# Patient Record
Sex: Female | Born: 1955
Health system: Southern US, Community
[De-identification: ages and names within clinical notes are randomized; demographics above are authoritative.]

## PROBLEM LIST (undated history)

## (undated) DIAGNOSIS — E785 Hyperlipidemia, unspecified: Secondary | ICD-10-CM

## (undated) DIAGNOSIS — F419 Anxiety disorder, unspecified: Secondary | ICD-10-CM

## (undated) DIAGNOSIS — K219 Gastro-esophageal reflux disease without esophagitis: Secondary | ICD-10-CM

## (undated) DIAGNOSIS — M8580 Other specified disorders of bone density and structure, unspecified site: Secondary | ICD-10-CM

## (undated) DIAGNOSIS — E559 Vitamin D deficiency, unspecified: Secondary | ICD-10-CM

## (undated) DIAGNOSIS — F329 Major depressive disorder, single episode, unspecified: Secondary | ICD-10-CM

## (undated) DIAGNOSIS — R1319 Other dysphagia: Secondary | ICD-10-CM

## (undated) DIAGNOSIS — I10 Essential (primary) hypertension: Secondary | ICD-10-CM

## (undated) DIAGNOSIS — G4700 Insomnia, unspecified: Secondary | ICD-10-CM

## (undated) DIAGNOSIS — F32A Depression, unspecified: Secondary | ICD-10-CM

## (undated) HISTORY — DX: Anxiety disorder, unspecified: F41.9

## (undated) HISTORY — PX: BREAST EXCISIONAL BIOPSY: SUR124

## (undated) HISTORY — PX: SQUAMOUS CELL CARCINOMA EXCISION: SHX2433

## (undated) HISTORY — PX: COLONOSCOPY WITH ESOPHAGOGASTRODUODENOSCOPY (EGD): SHX5779

## (undated) HISTORY — DX: Gastro-esophageal reflux disease without esophagitis: K21.9

## (undated) HISTORY — PX: BREAST SURGERY: SHX581

## (undated) HISTORY — DX: Depression, unspecified: F32.A

---
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
## 1998-06-07 HISTORY — PX: ESOPHAGOGASTRIC FUNDOPLICATION: SHX405

## 2005-02-02 ENCOUNTER — Ambulatory Visit: Payer: Self-pay | Admitting: Unknown Physician Specialty

## 2006-02-03 ENCOUNTER — Ambulatory Visit: Payer: Self-pay | Admitting: Unknown Physician Specialty

## 2007-02-14 ENCOUNTER — Ambulatory Visit: Payer: Self-pay | Admitting: Unknown Physician Specialty

## 2007-07-11 ENCOUNTER — Ambulatory Visit: Payer: Self-pay | Admitting: Unknown Physician Specialty

## 2007-12-13 ENCOUNTER — Ambulatory Visit: Payer: Self-pay | Admitting: Internal Medicine

## 2008-02-16 ENCOUNTER — Ambulatory Visit: Payer: Self-pay | Admitting: Unknown Physician Specialty

## 2008-08-15 ENCOUNTER — Ambulatory Visit: Payer: Self-pay | Admitting: Internal Medicine

## 2008-12-17 ENCOUNTER — Ambulatory Visit: Payer: Self-pay | Admitting: Unknown Physician Specialty

## 2009-02-20 ENCOUNTER — Ambulatory Visit: Payer: Self-pay | Admitting: Internal Medicine

## 2009-05-31 ENCOUNTER — Emergency Department: Payer: Self-pay | Admitting: Emergency Medicine

## 2010-05-06 ENCOUNTER — Ambulatory Visit: Payer: Self-pay

## 2011-06-15 ENCOUNTER — Ambulatory Visit: Payer: Self-pay

## 2012-08-24 ENCOUNTER — Ambulatory Visit: Payer: Self-pay

## 2012-09-08 ENCOUNTER — Ambulatory Visit: Payer: Self-pay

## 2013-09-10 ENCOUNTER — Ambulatory Visit: Payer: Self-pay

## 2013-10-01 ENCOUNTER — Ambulatory Visit: Payer: Self-pay | Admitting: Internal Medicine

## 2013-10-22 ENCOUNTER — Ambulatory Visit: Payer: Self-pay | Admitting: Unknown Physician Specialty

## 2013-10-22 LAB — HM COLONOSCOPY

## 2013-10-24 LAB — PATHOLOGY REPORT

## 2016-04-19 ENCOUNTER — Ambulatory Visit
Admission: RE | Admit: 2016-04-19 | Discharge: 2016-04-19 | Disposition: A | Discharge status: Home / Self Care | Payer: Self-pay | Source: Ambulatory Visit | Attending: Oncology | Admitting: Oncology

## 2016-04-19 ENCOUNTER — Encounter (INDEPENDENT_AMBULATORY_CARE_PROVIDER_SITE_OTHER): Payer: Self-pay

## 2016-04-19 ENCOUNTER — Ambulatory Visit: Payer: Self-pay | Attending: Oncology

## 2016-04-19 VITALS — BP 156/92 | HR 76 | Temp 98.1°F | Ht 65.0 in | Wt 141.0 lb

## 2016-04-19 DIAGNOSIS — Z0000 Encounter for general adult medical examination without abnormal findings: Secondary | ICD-10-CM

## 2016-04-19 NOTE — Progress Notes (Signed)
Subjective:     Patient ID: Pamela Barrett, female   DOB: September 08, 1955, 60 y.o.   MRN: [id number]  HPI   Review of Systems     Objective:   Physical Exam  Pulmonary/Chest: Right breast exhibits no inverted nipple, no mass, no nipple discharge, no skin change and no tenderness. Left breast exhibits no inverted nipple, no mass, no nipple discharge, no skin change and no tenderness. Breasts are symmetrical.         Assessment:  60 year old femalepatient presents for [facility] clinic visit.  Patient screened, and meets [facility] eligibility.  Patient does not have insurance, Medicare or Medicaid.  Handout given on Affordable Care Act.  Instructed patient on breast self-exam using teach back method.  CBE unremarkable.  Had a benign left breast biopsy at age 60 with Dr. Jamal Collin.  Patient states she her husband passed away 8 years go.  Her 45 year old grandson lives with her.      Plan:     Sent for bilateral screening mammogram.

## 2016-04-20 NOTE — Progress Notes (Unsigned)
Letter mailed from [facility] to notify of normal mammogram results.  Patient to return in one year for annual screening.  Copy to [facility]. 

## 2016-04-21 ENCOUNTER — Ambulatory Visit: Payer: Self-pay

## 2016-05-04 DIAGNOSIS — N939 Abnormal uterine and vaginal bleeding, unspecified: Secondary | ICD-10-CM

## 2016-05-04 HISTORY — DX: Abnormal uterine and vaginal bleeding, unspecified: N93.9

## 2019-04-22 ENCOUNTER — Encounter: Payer: Self-pay | Admitting: Nurse Practitioner

## 2019-04-24 ENCOUNTER — Other Ambulatory Visit: Payer: Self-pay

## 2019-04-25 ENCOUNTER — Ambulatory Visit (INDEPENDENT_AMBULATORY_CARE_PROVIDER_SITE_OTHER): Payer: BLUE CROSS/BLUE SHIELD | Admitting: Family Medicine

## 2019-04-25 ENCOUNTER — Encounter: Payer: Self-pay | Admitting: Family Medicine

## 2019-04-25 VITALS — BP 126/79 | HR 117 | Temp 98.4°F | Ht 64.0 in | Wt 175.2 lb

## 2019-04-25 DIAGNOSIS — M25511 Pain in right shoulder: Secondary | ICD-10-CM | POA: Diagnosis not present

## 2019-04-25 DIAGNOSIS — Z1231 Encounter for screening mammogram for malignant neoplasm of breast: Secondary | ICD-10-CM

## 2019-04-25 DIAGNOSIS — Z23 Encounter for immunization: Secondary | ICD-10-CM | POA: Diagnosis not present

## 2019-04-25 DIAGNOSIS — F419 Anxiety disorder, unspecified: Secondary | ICD-10-CM

## 2019-04-25 DIAGNOSIS — R222 Localized swelling, mass and lump, trunk: Secondary | ICD-10-CM | POA: Diagnosis not present

## 2019-04-25 DIAGNOSIS — G8929 Other chronic pain: Secondary | ICD-10-CM

## 2019-04-25 DIAGNOSIS — Z1211 Encounter for screening for malignant neoplasm of colon: Secondary | ICD-10-CM

## 2019-04-25 MED ORDER — CYCLOBENZAPRINE HCL 10 MG PO TABS: 10.0000 mg | ORAL_TABLET | Freq: Every day | ORAL | 0 refills | Status: DC

## 2019-04-25 MED ORDER — NAPROXEN 500 MG PO TABS: 500.0000 mg | ORAL_TABLET | Freq: Two times a day (BID) | ORAL | 0 refills | Status: DC

## 2019-04-25 NOTE — Progress Notes (Signed)
BP 126/79   Pulse (!) 117   Temp 98.4 F (36.9 C) (Oral)   Ht 5\' 4"  (1.626 m)   Wt 175 lb 3.2 oz (79.5 kg)   SpO2 97%   BMI 30.07 kg/m    Subjective:    Patient ID: Pamela Barrett, female    DOB: 1955-12-20, 63 y.o.   MRN: [id number]  HPI: Pamela Barrett is a 63 y.o. female who presents today to establish care.   Chief Complaint  Patient presents with  . Establish Care  . Shoulder Pain    pt states her right shoulder has been bothering her for the last 3 months  . Cyst    on abdomin, just wants it checked    ANXIETY/STRESS Duration:controlled Anxious mood: no  Excessive worrying: no Irritability: no  Sweating: no Nausea: no Palpitations:no Hyperventilation: no Panic attacks: no Agoraphobia: no  Obscessions/compulsions: no Depressed mood: no Depression screen PHQ 2/9 04/25/2019  Decreased Interest 0  Down, Depressed, Hopeless 0  PHQ - 2 Score 0  Altered sleeping 0  Tired, decreased energy 0  Change in appetite 0  Feeling bad or failure about yourself  0  Trouble concentrating 0  Moving slowly or fidgety/restless 0  Suicidal thoughts 0  PHQ-9 Score 0  Difficult doing work/chores Not difficult at all   GAD 7 : Generalized Anxiety Score 04/25/2019  Nervous, Anxious, on Edge 0  Control/stop worrying 0  Worry too much - different things 0  Trouble relaxing 0  Restless 0  Easily annoyed or irritable 0  Afraid - awful might happen 0  Total GAD 7 Score 0  Anxiety Difficulty Not difficult at all   Anhedonia: no Weight changes: no Insomnia: no   Hypersomnia: no Fatigue/loss of energy: no Feelings of worthlessness: no Feelings of guilt: no Impaired concentration/indecisiveness: no Suicidal ideations: no  Crying spells: no Recent Stressors/Life Changes: no   Relationship problems: no   Family stress: no     Financial stress: no    Job stress: no    Recent death/loss: no  SHOULDER PAIN- works as a Scientist, product/process development, has her own business. Has been  bothering her R arm for about 3 months. Notes that that is the arm she uses a lot to clean Duration: 3 months Involved shoulder: right Mechanism of injury: unknown Location: trap into her biceps Onset:gradual Severity: moderate  Quality:  Dull ache Frequency: intermittent, has been constant for the past week Radiation: into the trap Aggravating factors: lifting and movement  Alleviating factors: nothing  Status: worse Treatments attempted: rest, ice and heat  Relief with NSAIDs?:  No NSAIDs Taken Weakness: no Numbness: no Decreased grip strength: no Redness: no Swelling: no Bruising: no Fevers: no   LUMP Duration: 4 months- hasn't had any one look at yet  Location: L side of her belly Onset: gradual Painful: no Discomfort: no Status:  unknown Trauma: no Redness: no Bruising: no Recent infection: no Swollen lymph nodes: no Requesting removal: no History of the same: no  Active Ambulatory Problems    Diagnosis Date Noted  . Anxiety 04/25/2019   Resolved Ambulatory Problems    Diagnosis Date Noted  . Abnormal uterine bleeding (AUB) 05/04/2016   Past Medical History:  Diagnosis Date  . Depression   . GERD (gastroesophageal reflux disease)    Past Surgical History:  Procedure Laterality Date  . BREAST EXCISIONAL BIOPSY Left 20+ yrs ago   neg  . ESOPHAGOGASTRIC FUNDOPLICATION  [id number]   Outpatient Encounter  Medications as of 04/25/2019  Medication Sig  . aspirin EC 81 MG tablet Take 81 mg by mouth daily.  . citalopram (CELEXA) 20 MG tablet Take 20 mg by mouth daily.  . cyclobenzaprine (FLEXERIL) 10 MG tablet Take 1 tablet (10 mg total) by mouth at bedtime.  . naproxen (NAPROSYN) 500 MG tablet Take 1 tablet (500 mg total) by mouth 2 (two) times daily with a meal.   No facility-administered encounter medications on file as of 04/25/2019.    No Known Allergies  Social History   Socioeconomic History  . Marital status: Widowed    Spouse name: Not on file  .  Number of children: Not on file  . Years of education: Not on file  . Highest education level: Not on file  Occupational History  . Not on file  Social Needs  . Financial resource strain: Not on file  . Food insecurity    Worry: Not on file    Inability: Not on file  . Transportation needs    Medical: Not on file    Non-medical: Not on file  Tobacco Use  . Smoking status: Former Smoker    Types: Cigarettes    Quit date: 04/07/2017    Years since quitting: 2.0  . Smokeless tobacco: Never Used  Substance and Sexual Activity  . Alcohol use: Not Currently  . Drug use: Never  . Sexual activity: Not Currently  Lifestyle  . Physical activity    Days per week: Not on file    Minutes per session: Not on file  . Stress: Not on file  Relationships  . Social Herbalist on phone: Not on file    Gets together: Not on file    Attends religious service: Not on file    Active member of club or organization: Not on file    Attends meetings of clubs or organizations: Not on file    Relationship status: Not on file  Other Topics Concern  . Not on file  Social History Narrative  . Not on file   Family History  Problem Relation Age of Onset  . Alzheimer's disease Father   . Cancer Sister        colon  . Cancer Paternal Grandfather        colon    Review of Systems  Constitutional: Negative.   Respiratory: Negative.   Cardiovascular: Negative.   Gastrointestinal: Negative.   Musculoskeletal: Positive for arthralgias and myalgias. Negative for back pain, gait problem, joint swelling, neck pain and neck stiffness.  Skin: Negative.   Neurological: Negative.   Hematological: Negative.   Psychiatric/Behavioral: Negative.     Per HPI unless specifically indicated above     Objective:    BP 126/79   Pulse (!) 117   Temp 98.4 F (36.9 C) (Oral)   Ht 5\' 4"  (1.626 m)   Wt 175 lb 3.2 oz (79.5 kg)   SpO2 97%   BMI 30.07 kg/m   Wt Readings from Last 3 Encounters:   04/25/19 175 lb 3.2 oz (79.5 kg)  04/19/16 141 lb (64 kg)    Physical Exam Vitals signs and nursing note reviewed.  Constitutional:      General: She is not in acute distress.    Appearance: Normal appearance. She is not ill-appearing, toxic-appearing or diaphoretic.  HENT:     Head: Normocephalic and atraumatic.     Right Ear: External ear normal.     Left Ear: External ear  normal.     Nose: Nose normal.     Mouth/Throat:     Mouth: Mucous membranes are moist.     Pharynx: Oropharynx is clear.  Eyes:     General: No scleral icterus.       Right eye: No discharge.        Left eye: No discharge.     Extraocular Movements: Extraocular movements intact.     Conjunctiva/sclera: Conjunctivae normal.     Pupils: Pupils are equal, round, and reactive to light.  Neck:     Musculoskeletal: Normal range of motion and neck supple.  Cardiovascular:     Rate and Rhythm: Normal rate and regular rhythm.     Pulses: Normal pulses.     Heart sounds: Normal heart sounds. No murmur. No friction rub. No gallop.   Pulmonary:     Effort: Pulmonary effort is normal. No respiratory distress.     Breath sounds: Normal breath sounds. No stridor. No wheezing, rhonchi or rales.  Chest:     Chest wall: No tenderness.  Abdominal:     General: Abdomen is flat. Bowel sounds are normal. There is no distension.     Palpations: Abdomen is soft. There is mass (non-defined thickening in LUQ, does not feel visceral).     Tenderness: There is no abdominal tenderness. There is no right CVA tenderness, left CVA tenderness, guarding or rebound.     Hernia: No hernia is present.  Musculoskeletal:        General: Tenderness (in trap and along bicep insertion) present.     Comments: Decreased ROM to abduction  Skin:    General: Skin is warm and dry.     Capillary Refill: Capillary refill takes less than 2 seconds.     Coloration: Skin is not jaundiced or pale.     Findings: No bruising, erythema, lesion or rash.   Neurological:     General: No focal deficit present.     Mental Status: She is alert and oriented to person, place, and time. Mental status is at baseline.  Psychiatric:        Mood and Affect: Mood normal.        Behavior: Behavior normal.        Thought Content: Thought content normal.        Judgment: Judgment normal.     Results for orders placed or performed in visit on 04/25/19  HM COLONOSCOPY  Result Value Ref Range   HM Colonoscopy See Report (in chart) See Report (in chart), Patient Reported      Assessment & Plan:   Problem List Items Addressed This Visit      Other   Anxiety    Under good control on her current regimen. Has another refill on it so doesn't need it just yet. Call with any concerns. Continue to monitor.      Relevant Medications   citalopram (CELEXA) 20 MG tablet    Other Visit Diagnoses    Chronic right shoulder pain    -  Primary   Will start stretches, naproxen and flexeril. Call if not getting better, recheck 1 month. May need x-ray, PT, if not improving.    Relevant Medications   aspirin EC 81 MG tablet   citalopram (CELEXA) 20 MG tablet   cyclobenzaprine (FLEXERIL) 10 MG tablet   naproxen (NAPROSYN) 500 MG tablet   Subcutaneous nodule of abdominal wall       Ill-defined on exam. Will check [place]. Await  results. Call wiht any concerns.    Relevant Orders   US Abdomen Complete   Need for influenza vaccination       Flu shot given today.    Relevant Orders   Flu Vaccine QUAD 36+ mos PF IM (Fluarix & Fluzone Quad PF) (Completed)   Encounter for screening mammogram for malignant neoplasm of breast       Mammogram ordered today.   Relevant Orders   MM 3D SCREEN BREAST BILATERAL   Need for Tdap vaccination       Tdap given today.   Relevant Orders   Tdap vaccine greater than or equal to 7yo IM (Completed)   Screening for colon cancer       Due for colon cancer- referral generated today.   Relevant Orders   Ambulatory referral to  Gastroenterology       Follow up plan: Return in about 4 weeks (around 05/23/2019) for physical with Jolene, please get records from Dr. Gwynneth Aliment.

## 2019-04-25 NOTE — Assessment & Plan Note (Signed)
Under good control on her current regimen. Has another refill on it so doesn't need it just yet. Call with any concerns. Continue to monitor.

## 2019-04-25 NOTE — Patient Instructions (Addendum)
Call for your mammogram:[place] [facility]  Address: [address], [place], [place] [address]  Phone: [phone]   Influenza (Flu) Vaccine (Inactivated or Recombinant): What You Need to Know 1. Why get vaccinated? Influenza vaccine can prevent influenza (flu). Flu is a contagious disease that spreads around the [place] every year, usually between October and May. Anyone can get the flu, but it is more dangerous for some people. Infants and young children, people 7 years of age and older, pregnant women, and people with certain health conditions or a weakened immune system are at greatest risk of flu complications. Pneumonia, bronchitis, sinus infections and ear infections are examples of flu-related complications. If you have a medical condition, such as heart disease, cancer or diabetes, flu can make it worse. Flu can cause fever and chills, sore throat, muscle aches, fatigue, cough, headache, and runny or stuffy nose. Some people may have vomiting and diarrhea, though this is more common in children than adults. Each year thousands of people in the [place] die from flu, and many more are hospitalized. Flu vaccine prevents millions of illnesses and flu-related visits to the doctor each year. 2. Influenza vaccine CDC recommends everyone 18 months of age and older get vaccinated every flu season. Children 6 months through 14 years of age may need 2 doses during a single flu season. Everyone else needs only 1 dose each flu season. It takes about 2 weeks for protection to develop after vaccination. There are many flu viruses, and they are always changing. Each year a new flu vaccine is made to protect against three or four viruses that are likely to cause disease in the upcoming flu season. Even when the vaccine doesn't exactly match these viruses, it may still provide some protection. Influenza vaccine does not cause flu. Influenza vaccine may be given at  the same time as other vaccines. 3. Talk with your health care provider Tell your vaccine provider if the person getting the vaccine:  Has had an allergic reaction after a previous dose of influenza vaccine, or has any severe, life-threatening allergies.  Has ever had Guillain-Barr Syndrome (also called GBS). In some cases, your health care provider may decide to postpone influenza vaccination to a future visit. People with minor illnesses, such as a cold, may be vaccinated. People who are moderately or severely ill should usually wait until they recover before getting influenza vaccine. Your health care provider can give you more information. 4. Risks of a vaccine reaction  Soreness, redness, and swelling where shot is given, fever, muscle aches, and headache can happen after influenza vaccine.  There may be a very small increased risk of Guillain-Barr Syndrome (GBS) after inactivated influenza vaccine (the flu shot). Young children who get the flu shot along with pneumococcal vaccine (PCV13), and/or DTaP vaccine at the same time might be slightly more likely to have a seizure caused by fever. Tell your health care provider if a child who is getting flu vaccine has ever had a seizure. People sometimes faint after medical procedures, including vaccination. Tell your provider if you feel dizzy or have vision changes or ringing in the ears. As with any medicine, there is a very remote chance of a vaccine causing a severe allergic reaction, other serious injury, or death. 5. What if there is a serious problem? An allergic reaction could occur after the vaccinated person leaves the clinic. If you see signs of a severe allergic reaction (hives, swelling of the  face and throat, difficulty breathing, a fast heartbeat, dizziness, or weakness), call 9-1-1 and get the person to the nearest hospital. For other signs that concern you, call your health care provider. Adverse reactions should be reported to  the Vaccine Adverse Event Reporting System (VAERS). Your health care provider will usually file this report, or you can do it yourself. Visit the VAERS website at www.vaers.SamedayNews.es or call 346-171-2911.VAERS is only for reporting reactions, and VAERS staff do not give medical advice. 6. The National Vaccine Injury Compensation Program The [employer] Vaccine Injury Compensation Program (VICP) is a federal program that was created to compensate people who may have been injured by certain vaccines. Visit the VICP website at GoldCloset.com.ee or call 5121372669 to learn about the program and about filing a claim. There is a time limit to file a claim for compensation. 7. How can I learn more?  Ask your healthcare provider.  Call your local or state health department.  Contact the Centers for Disease Control and Prevention (CDC): ? Call 7133479421 (1-800-CDC-INFO) or ? Visit CDC's https://gibson.com/ Vaccine Information Statement (Interim) Inactivated Influenza Vaccine (01/19/2018) This information is not intended to replace advice given to you by your health care provider. Make sure you discuss any questions you have with your health care provider. Document Released: 03/18/2006 Document Revised: 09/12/2018 Document Reviewed: 01/23/2018 Elsevier Patient Education  [place]. https://www.cdc.gov/vaccines/hcp/vis/vis-statements/tdap.pdf">  Tdap Vaccine (Tetanus, Diphtheria and Pertussis): What You Need to Know 1. Why get vaccinated? Tetanus, diphtheria and pertussis are very serious diseases. Tdap vaccine can protect [place] from these diseases. And, Tdap vaccine given to pregnant women can protect newborn babies against pertussis.Marland Kitchen TETANUS (Lockjaw) is rare in the [place] today. It causes painful muscle tightening and stiffness, usually all over the body.  It can lead to tightening of muscles in the head and neck so you can't open your mouth, swallow, or sometimes even  breathe. Tetanus kills about 1 out of 10 people who are infected even after receiving the best medical care. DIPHTHERIA is also rare in the [place] today. It can cause a thick coating to form in the back of the throat.  It can lead to breathing problems, heart failure, paralysis, and death. PERTUSSIS (Whooping Cough) causes severe coughing spells, which can cause difficulty breathing, vomiting and disturbed sleep.  It can also lead to weight loss, incontinence, and rib fractures. Up to 2 in 100 adolescents and 5 in 100 adults with pertussis are hospitalized or have complications, which could include pneumonia or death. These diseases are caused by bacteria. Diphtheria and pertussis are spread from person to person through secretions from coughing or sneezing. Tetanus enters the body through cuts, scratches, or wounds. Before vaccines, as many as 200,000 cases of diphtheria, 200,000 cases of pertussis, and hundreds of cases of tetanus, were reported in the [place] each year. Since vaccination began, reports of cases for tetanus and diphtheria have dropped by about 99% and for pertussis by about 80%. 2. Tdap vaccine Tdap vaccine can protect adolescents and adults from tetanus, diphtheria, and pertussis. One dose of Tdap is routinely given at age 23 or 9. People who did not get Tdap at that age should get it as soon as possible. Tdap is especially important for healthcare professionals and anyone having close contact with a baby younger than 12 months. Pregnant women should get a dose of Tdap during every pregnancy, to protect the newborn from pertussis. Infants are most at risk for severe, life-threatening complications from  pertussis. Another vaccine, called Td, protects against tetanus and diphtheria, but not pertussis. A Td booster should be given every 10 years. Tdap may be given as one of these boosters if you have never gotten Tdap before. Tdap may also be given after a severe cut or  burn to prevent tetanus infection. Your doctor or the person giving you the vaccine can give you more information. Tdap may safely be given at the same time as other vaccines. 3. Some people should not get this vaccine  A person who has ever had a life-threatening allergic reaction after a previous dose of any diphtheria, tetanus or pertussis containing vaccine, OR has a severe allergy to any part of this vaccine, should not get Tdap vaccine. Tell the person giving the vaccine about any severe allergies.  Anyone who had coma or long repeated seizures within 7 days after a childhood dose of DTP or DTaP, or a previous dose of Tdap, should not get Tdap, unless a cause other than the vaccine was found. They can still get Td.  Talk to your doctor if you: ? have seizures or another nervous system problem, ? had severe pain or swelling after any vaccine containing diphtheria, tetanus or pertussis, ? ever had a condition called Guillain-Barr Syndrome (GBS), ? aren't feeling well on the day the shot is scheduled. 4. Risks With any medicine, including vaccines, there is a chance of side effects. These are usually mild and go away on their own. Serious reactions are also possible but are rare. Most people who get Tdap vaccine do not have any problems with it. Mild problems following Tdap (Did not interfere with activities)  Pain where the shot was given (about 3 in 4 adolescents or 2 in 3 adults)  Redness or swelling where the shot was given (about 1 person in 5)  Mild fever of at least 100.36F (up to about 1 in 25 adolescents or 1 in 100 adults)  Headache (about 3 or 4 people in 10)  Tiredness (about 1 person in 3 or 4)  Nausea, vomiting, diarrhea, stomach ache (up to 1 in 4 adolescents or 1 in 10 adults)  Chills, sore joints (about 1 person in 10)  Body aches (about 1 person in 3 or 4)  Rash, swollen glands (uncommon) Moderate problems following Tdap (Interfered with activities, but did  not require medical attention)  Pain where the shot was given (up to 1 in 5 or 6)  Redness or swelling where the shot was given (up to about 1 in 16 adolescents or 1 in 12 adults)  Fever over 102F (about 1 in 100 adolescents or 1 in 250 adults)  Headache (about 1 in 7 adolescents or 1 in 10 adults)  Nausea, vomiting, diarrhea, stomach ache (up to 1 or 3 people in 100)  Swelling of the entire arm where the shot was given (up to about 1 in 500). Severe problems following Tdap (Unable to perform usual activities; required medical attention)  Swelling, severe pain, bleeding and redness in the arm where the shot was given (rare). Problems that could happen after any vaccine:  People sometimes faint after a medical procedure, including vaccination. Sitting or lying down for about 15 minutes can help prevent fainting, and injuries caused by a fall. Tell your doctor if you feel dizzy, or have vision changes or ringing in the ears.  Some people get severe pain in the shoulder and have difficulty moving the arm where a shot was given. This happens  very rarely.  Any medication can cause a severe allergic reaction. Such reactions from a vaccine are very rare, estimated at fewer than 1 in a million doses, and would happen within a few minutes to a few hours after the vaccination. As with any medicine, there is a very remote chance of a vaccine causing a serious injury or death. The safety of vaccines is always being monitored. For more information, visit: http://www.aguilar.org/ 5. What if there is a serious problem? What should I look for?  Look for anything that concerns you, such as signs of a severe allergic reaction, very high fever, or unusual behavior. Signs of a severe allergic reaction can include hives, swelling of the face and throat, difficulty breathing, a fast heartbeat, dizziness, and weakness. These would usually start a few minutes to a few hours after the vaccination. What  should I do?  If you think it is a severe allergic reaction or other emergency that can't wait, call 9-1-1 or get the person to the nearest hospital. Otherwise, call your doctor.  Afterward, the reaction should be reported to the Vaccine Adverse Event Reporting System (VAERS). Your doctor might file this report, or you can do it yourself through the VAERS web site at www.vaers.SamedayNews.es, or by calling (786) 217-9933. VAERS does not give medical advice. 6. The National Vaccine Injury Compensation Program The [employer] Vaccine Injury Compensation Program (VICP) is a federal program that was created to compensate people who may have been injured by certain vaccines. Persons who believe they may have been injured by a vaccine can learn about the program and about filing a claim by calling 440 213 5513 or visiting the [place] website at GoldCloset.com.ee. There is a time limit to file a claim for compensation. 7. How can I learn more?  Ask your doctor. He or she can give you the vaccine package insert or suggest other sources of information.  Call your local or state health department.  Contact the Centers for Disease Control and Prevention (CDC): ? Call 3012041118 (1-800-CDC-INFO) or ? Visit CDC's website at http://hunter.com/ Vaccine Information Statement Tdap Vaccine (07/31/2013) This information is not intended to replace advice given to you by your health care provider. Make sure you discuss any questions you have with your health care provider. Document Released: 11/23/2011 Document Revised: 01/09/2018 Document Reviewed: 01/09/2018 Elsevier Interactive Patient Education  [place].  Shoulder Exercises Ask your health care provider which exercises are safe for you. Do exercises exactly as told by your health care provider and adjust them as directed. It is normal to feel mild stretching, pulling, tightness, or discomfort as you do these exercises. Stop right away if  you feel sudden pain or your pain gets worse. Do not begin these exercises until told by your health care provider. Stretching exercises External rotation and abduction This exercise is sometimes called corner stretch. This exercise rotates your arm outward (external rotation) and moves your arm out from your body (abduction). 1. Stand in a doorway with one of your feet slightly in front of the other. This is called a staggered stance. If you cannot reach your forearms to the door frame, stand facing a corner of a room. 2. Choose one of the following positions as told by your health care provider: ? Place your hands and forearms on the door frame above your head. ? Place your hands and forearms on the door frame at the height of your head. ? Place your hands on the door frame at the height of  your elbows. 3. Slowly move your weight onto your front foot until you feel a stretch across your chest and in the front of your shoulders. Keep your head and chest upright and keep your abdominal muscles tight. 4. Hold for __________ seconds. 5. To release the stretch, shift your weight to your back foot. Repeat __________ times. Complete this exercise __________ times a day. Extension, standing 1. Stand and hold a broomstick, a cane, or a similar object behind your back. ? Your hands should be a little wider than shoulder width apart. ? Your palms should face away from your back. 2. Keeping your elbows straight and your shoulder muscles relaxed, move the stick away from your body until you feel a stretch in your shoulders (extension). ? Avoid shrugging your shoulders while you move the stick. Keep your shoulder blades tucked down toward the middle of your back. 3. Hold for __________ seconds. 4. Slowly return to the starting position. Repeat __________ times. Complete this exercise __________ times a day. Range-of-motion exercises Pendulum  1. Stand near a wall or a surface that you can hold onto for  balance. 2. Bend at the waist and let your left / right arm hang straight down. Use your other arm to support you. Keep your back straight and do not lock your knees. 3. Relax your left / right arm and shoulder muscles, and move your hips and your trunk so your left / right arm swings freely. Your arm should swing because of the motion of your body, not because you are using your arm or shoulder muscles. 4. Keep moving your hips and trunk so your arm swings in the following directions, as told by your health care provider: ? Side to side. ? Forward and backward. ? In clockwise and counterclockwise circles. 5. Continue each motion for __________ seconds, or for as long as told by your health care provider. 6. Slowly return to the starting position. Repeat __________ times. Complete this exercise __________ times a day. Shoulder flexion, standing  1. Stand and hold a broomstick, a cane, or a similar object. Place your hands a little more than shoulder width apart on the object. Your left / right hand should be palm up, and your other hand should be palm down. 2. Keep your elbow straight and your shoulder muscles relaxed. Push the stick up with your healthy arm to raise your left / right arm in front of your body, and then over your head until you feel a stretch in your shoulder (flexion). ? Avoid shrugging your shoulder while you raise your arm. Keep your shoulder blade tucked down toward the middle of your back. 3. Hold for __________ seconds. 4. Slowly return to the starting position. Repeat __________ times. Complete this exercise __________ times a day. Shoulder abduction, standing 1. Stand and hold a broomstick, a cane, or a similar object. Place your hands a little more than shoulder width apart on the object. Your left / right hand should be palm up, and your other hand should be palm down. 2. Keep your elbow straight and your shoulder muscles relaxed. Push the object across your body toward  your left / right side. Raise your left / right arm to the side of your body (abduction) until you feel a stretch in your shoulder. ? Do not raise your arm above shoulder height unless your health care provider tells you to do that. ? If directed, raise your arm over your head. ? Avoid shrugging your shoulder while you  raise your arm. Keep your shoulder blade tucked down toward the middle of your back. 3. Hold for __________ seconds. 4. Slowly return to the starting position. Repeat __________ times. Complete this exercise __________ times a day. Internal rotation  1. Place your left / right hand behind your back, palm up. 2. Use your other hand to dangle an exercise band, a towel, or a similar object over your shoulder. Grasp the band with your left / right hand so you are holding on to both ends. 3. Gently pull up on the band until you feel a stretch in the front of your left / right shoulder. The movement of your arm toward the center of your body is called internal rotation. ? Avoid shrugging your shoulder while you raise your arm. Keep your shoulder blade tucked down toward the middle of your back. 4. Hold for __________ seconds. 5. Release the stretch by letting go of the band and lowering your hands. Repeat __________ times. Complete this exercise __________ times a day. Strengthening exercises External rotation  1. Sit in a stable chair without armrests. 2. Secure an exercise band to a stable object at elbow height on your left / right side. 3. Place a soft object, such as a folded towel or a small pillow, between your left / right upper arm and your body to move your elbow about 4 inches (10 cm) away from your side. 4. Hold the end of the exercise band so it is tight and there is no slack. 5. Keeping your elbow pressed against the soft object, slowly move your forearm out, away from your abdomen (external rotation). Keep your body steady so only your forearm moves. 6. Hold for  __________ seconds. 7. Slowly return to the starting position. Repeat __________ times. Complete this exercise __________ times a day. Shoulder abduction  1. Sit in a stable chair without armrests, or stand up. 2. Hold a __________ weight in your left / right hand, or hold an exercise band with both hands. 3. Start with your arms straight down and your left / right palm facing in, toward your body. 4. Slowly lift your left / right hand out to your side (abduction). Do not lift your hand above shoulder height unless your health care provider tells you that this is safe. ? Keep your arms straight. ? Avoid shrugging your shoulder while you do this movement. Keep your shoulder blade tucked down toward the middle of your back. 5. Hold for __________ seconds. 6. Slowly lower your arm, and return to the starting position. Repeat __________ times. Complete this exercise __________ times a day. Shoulder extension 1. Sit in a stable chair without armrests, or stand up. 2. Secure an exercise band to a stable object in front of you so it is at shoulder height. 3. Hold one end of the exercise band in each hand. Your palms should face each other. 4. Straighten your elbows and lift your hands up to shoulder height. 5. Step back, away from the secured end of the exercise band, until the band is tight and there is no slack. 6. Squeeze your shoulder blades together as you pull your hands down to the sides of your thighs (extension). Stop when your hands are straight down by your sides. Do not let your hands go behind your body. 7. Hold for __________ seconds. 8. Slowly return to the starting position. Repeat __________ times. Complete this exercise __________ times a day. Shoulder row 1. Sit in a stable chair without  armrests, or stand up. 2. Secure an exercise band to a stable object in front of you so it is at waist height. 3. Hold one end of the exercise band in each hand. Position your palms so that  your thumbs are facing the ceiling (neutral position). 4. Bend each of your elbows to a 90-degree angle (right angle) and keep your upper arms at your sides. 5. Step back until the band is tight and there is no slack. 6. Slowly pull your elbows back behind you. 7. Hold for __________ seconds. 8. Slowly return to the starting position. Repeat __________ times. Complete this exercise __________ times a day. Shoulder press-ups  1. Sit in a stable chair that has armrests. Sit upright, with your feet flat on the floor. 2. Put your hands on the armrests so your elbows are bent and your fingers are pointing forward. Your hands should be about even with the sides of your body. 3. Push down on the armrests and use your arms to lift yourself off the chair. Straighten your elbows and lift yourself up as much as you comfortably can. ? Move your shoulder blades down, and avoid letting your shoulders move up toward your ears. ? Keep your feet on the ground. As you get stronger, your feet should support less of your body weight as you lift yourself up. 4. Hold for __________ seconds. 5. Slowly lower yourself back into the chair. Repeat __________ times. Complete this exercise __________ times a day. Wall push-ups  1. Stand so you are facing a stable wall. Your feet should be about one arm-length away from the wall. 2. Lean forward and place your palms on the wall at shoulder height. 3. Keep your feet flat on the floor as you bend your elbows and lean forward toward the wall. 4. Hold for __________ seconds. 5. Straighten your elbows to push yourself back to the starting position. Repeat __________ times. Complete this exercise __________ times a day. This information is not intended to replace advice given to you by your health care provider. Make sure you discuss any questions you have with your health care provider. Document Released: 04/07/2005 Document Revised: 09/15/2018 Document Reviewed:  06/23/2018 Elsevier Patient Education  2020 [employer].

## 2019-05-01 ENCOUNTER — Ambulatory Visit
Admission: RE | Admit: 2019-05-01 | Discharge: 2019-05-01 | Disposition: A | Discharge status: Home / Self Care | Payer: BLUE CROSS/BLUE SHIELD | Source: Ambulatory Visit | Attending: Family Medicine | Admitting: Family Medicine

## 2019-05-01 ENCOUNTER — Other Ambulatory Visit: Payer: Self-pay | Admitting: Family Medicine

## 2019-05-01 ENCOUNTER — Other Ambulatory Visit: Payer: Self-pay

## 2019-05-01 DIAGNOSIS — R161 Splenomegaly, not elsewhere classified: Secondary | ICD-10-CM

## 2019-05-01 DIAGNOSIS — R222 Localized swelling, mass and lump, trunk: Secondary | ICD-10-CM | POA: Diagnosis not present

## 2019-05-23 ENCOUNTER — Other Ambulatory Visit: Payer: Self-pay | Admitting: Family Medicine

## 2019-06-15 ENCOUNTER — Encounter: Payer: BLUE CROSS/BLUE SHIELD | Admitting: Nurse Practitioner

## 2019-06-24 ENCOUNTER — Other Ambulatory Visit: Payer: Self-pay | Admitting: Family Medicine

## 2019-07-20 ENCOUNTER — Ambulatory Visit (INDEPENDENT_AMBULATORY_CARE_PROVIDER_SITE_OTHER): Payer: 59 | Admitting: Nurse Practitioner

## 2019-07-20 ENCOUNTER — Other Ambulatory Visit: Payer: Self-pay

## 2019-07-20 ENCOUNTER — Encounter: Payer: Self-pay | Admitting: Nurse Practitioner

## 2019-07-20 VITALS — BP 119/83 | HR 81 | Temp 98.2°F | Ht 64.5 in | Wt 186.0 lb

## 2019-07-20 DIAGNOSIS — Z0000 Encounter for general adult medical examination without abnormal findings: Secondary | ICD-10-CM | POA: Diagnosis not present

## 2019-07-20 DIAGNOSIS — E669 Obesity, unspecified: Secondary | ICD-10-CM | POA: Insufficient documentation

## 2019-07-20 DIAGNOSIS — F419 Anxiety disorder, unspecified: Secondary | ICD-10-CM | POA: Diagnosis not present

## 2019-07-20 DIAGNOSIS — Z1231 Encounter for screening mammogram for malignant neoplasm of breast: Secondary | ICD-10-CM

## 2019-07-20 DIAGNOSIS — E6609 Other obesity due to excess calories: Secondary | ICD-10-CM

## 2019-07-20 DIAGNOSIS — Z6831 Body mass index (BMI) 31.0-31.9, adult: Secondary | ICD-10-CM

## 2019-07-20 DIAGNOSIS — Z87891 Personal history of nicotine dependence: Secondary | ICD-10-CM | POA: Diagnosis not present

## 2019-07-20 DIAGNOSIS — E559 Vitamin D deficiency, unspecified: Secondary | ICD-10-CM | POA: Diagnosis not present

## 2019-07-20 DIAGNOSIS — Z6829 Body mass index (BMI) 29.0-29.9, adult: Secondary | ICD-10-CM | POA: Insufficient documentation

## 2019-07-20 DIAGNOSIS — Z6828 Body mass index (BMI) 28.0-28.9, adult: Secondary | ICD-10-CM | POA: Insufficient documentation

## 2019-07-20 MED ORDER — BUSPIRONE HCL 5 MG PO TABS: 5.0000 mg | ORAL_TABLET | ORAL | 3 refills | Status: DC | PRN

## 2019-07-20 NOTE — Assessment & Plan Note (Signed)
History of low level reported, requested Vit D level check.  Will check and initiate supplement as needed.

## 2019-07-20 NOTE — Assessment & Plan Note (Signed)
Recommend continued cessation.  Smoked 1 PPD for 25 years and quit 3 years ago, not qualified for CT lung screening due to less than 30 years. ?

## 2019-07-20 NOTE — Progress Notes (Signed)
BP 119/83 (BP Location: Left Arm, Cuff Size: Normal)   Pulse 81   Temp 98.2 F (36.8 C) (Oral)   Ht 5' 4.5" (1.638 m)   Wt 186 lb (84.4 kg)   SpO2 98%   BMI 31.43 kg/m    Subjective:    Patient ID: Pamela Barrett, female    DOB: 09-30-1955, 64 y.o.   MRN: [id number]  HPI: Pamela Barrett is a 64 y.o. female presenting on 07/20/2019 for comprehensive medical examination. Current medical complaints include:none  She currently lives with: grandson -- is at [facility], major cyber security Menopausal Symptoms: no   ANXIETY/STRESS Continues on Celexa 20 MG.  Lost husband 11 years ago to massive MI.  States she is having chest pain again, anxiety presented like this 8 years ago and had normal cardiac work-up, was started on Celexa, which has been working well.  Her anxiety never presented with panic attacks or anxious feelings in past, just with chest pain.  She quit smoking 3 years ago, smoked for 25 years, 1 PPD. Duration:stable Anxious mood: no  Excessive worrying: no Irritability: no  Sweating: yes Nausea: no Palpitations:no Hyperventilation: no Panic attacks: no Agoraphobia: no  Obscessions/compulsions: no Depressed mood: no Depression screen [facility] 2/9 07/20/2019 04/25/2019  Decreased Interest 0 0  Down, Depressed, Hopeless 0 0  PHQ - 2 Score 0 0  Altered sleeping - 0  Tired, decreased energy - 0  Change in appetite - 0  Feeling bad or failure about yourself  - 0  Trouble concentrating - 0  Moving slowly or fidgety/restless - 0  Suicidal thoughts - 0  PHQ-9 Score - 0  Difficult doing work/chores - Not difficult at all   Anhedonia: no Weight changes: no Insomnia: none Hypersomnia: no Fatigue/loss of energy: no Feelings of worthlessness: no Feelings of guilt: no Impaired concentration/indecisiveness: yes Suicidal ideations: no  Crying spells: no Recent Stressors/Life Changes: no   Relationship problems: no   Family stress: no     Financial stress: no    Job  stress: no    Recent death/loss: no  The patient does not have a history of falls. I did not complete a risk assessment for falls. A plan of care for falls was not documented.   Past Medical History:  Past Medical History:  Diagnosis Date  . Abnormal uterine bleeding (AUB) 05/04/2016   Last Assessment & Plan:   Appears to be AUB-P vs AUB-M; likely cervical polyp visualized on speculum exam and EMB performed  TVUS ordered  My suspicion is that this is most likely a benign process. As she does have a 30 pack year history, I counseled the patient extensively on smoking cessation, as this is her biggest risk factor for chronic disease.   Endometrial Biopsy Procedure Note  Urine p  . Anxiety   . Depression   . GERD (gastroesophageal reflux disease)     Surgical History:  Past Surgical History:  Procedure Laterality Date  . BREAST EXCISIONAL BIOPSY Left 20+ yrs ago   neg  . ESOPHAGOGASTRIC FUNDOPLICATION  [id number]    Medications:  Current Outpatient Medications on File Prior to Visit  Medication Sig  . aspirin EC 81 MG tablet Take 81 mg by mouth daily.  . citalopram (CELEXA) 20 MG tablet Take 20 mg by mouth daily.   No current facility-administered medications on file prior to visit.    Allergies:  No Known Allergies  Social History:  Social History   Socioeconomic History  .  Marital status: Widowed    Spouse name: Not on file  . Number of children: Not on file  . Years of education: Not on file  . Highest education level: Not on file  Occupational History  . Not on file  Tobacco Use  . Smoking status: Former Smoker    Types: Cigarettes    Quit date: 04/07/2017    Years since quitting: 2.2  . Smokeless tobacco: Never Used  Substance and Sexual Activity  . Alcohol use: Not Currently  . Drug use: Never  . Sexual activity: Not Currently  Other Topics Concern  . Not on file  Social History Narrative  . Not on file   Social Determinants of Health   Financial  Resource Strain:   . Difficulty of Paying Living Expenses: Not on file  Food Insecurity:   . Worried About Charity fundraiser in the Last Year: Not on file  . Ran Out of Food in the Last Year: Not on file  Transportation Needs:   . Lack of Transportation (Medical): Not on file  . Lack of Transportation (Non-Medical): Not on file  Physical Activity:   . Days of Exercise per Week: Not on file  . Minutes of Exercise per Session: Not on file  Stress:   . Feeling of Stress : Not on file  Social Connections:   . Frequency of Communication with Friends and Family: Not on file  . Frequency of Social Gatherings with Friends and Family: Not on file  . Attends Religious Services: Not on file  . Active Member of Clubs or Organizations: Not on file  . Attends Archivist Meetings: Not on file  . Marital Status: Not on file  Intimate Partner Violence:   . Fear of Current or Ex-Partner: Not on file  . Emotionally Abused: Not on file  . Physically Abused: Not on file  . Sexually Abused: Not on file   Social History   Tobacco Use  Smoking Status Former Smoker  . Types: Cigarettes  . Quit date: 04/07/2017  . Years since quitting: 2.2  Smokeless Tobacco Never Used   Social History   Substance and Sexual Activity  Alcohol Use Not Currently    Family History:  Family History  Problem Relation Age of Onset  . Alzheimer's disease Father   . Cancer Sister        colon  . Skin cancer Brother   . Cancer Paternal Grandfather        colon  . Varicose Veins Son     Past medical history, surgical history, medications, allergies, family history and social history reviewed with patient today and changes made to appropriate areas of the chart.   Review of Systems - negative All other ROS negative except what is listed above and in the HPI.      Objective:    BP 119/83 (BP Location: Left Arm, Cuff Size: Normal)   Pulse 81   Temp 98.2 F (36.8 C) (Oral)   Ht 5' 4.5" (1.638 m)    Wt 186 lb (84.4 kg)   SpO2 98%   BMI 31.43 kg/m   Wt Readings from Last 3 Encounters:  07/20/19 186 lb (84.4 kg)  04/25/19 175 lb 3.2 oz (79.5 kg)  04/19/16 141 lb (64 kg)    Physical Exam Constitutional:      General: She is awake. She is not in acute distress.    Appearance: She is well-developed. She is not ill-appearing.  HENT:  Head: Normocephalic and atraumatic.     Right Ear: Hearing, tympanic membrane, ear canal and external ear normal. No drainage.     Left Ear: Hearing, tympanic membrane, ear canal and external ear normal. No drainage.     Nose: Nose normal.     Right Sinus: No maxillary sinus tenderness or frontal sinus tenderness.     Left Sinus: No maxillary sinus tenderness or frontal sinus tenderness.     Mouth/Throat:     Mouth: Mucous membranes are moist.     Pharynx: Oropharynx is clear. Uvula midline. No pharyngeal swelling, oropharyngeal exudate or posterior oropharyngeal erythema.  Eyes:     General: Lids are normal.        Right eye: No discharge.        Left eye: No discharge.     Extraocular Movements: Extraocular movements intact.     Conjunctiva/sclera: Conjunctivae normal.     Pupils: Pupils are equal, round, and reactive to light.     Visual Fields: Right eye visual fields normal and left eye visual fields normal.  Neck:     Thyroid: No thyromegaly.     Vascular: No carotid bruit.     Trachea: Trachea normal.  Cardiovascular:     Rate and Rhythm: Normal rate and regular rhythm.     Heart sounds: Normal heart sounds. No murmur. No gallop.   Pulmonary:     Effort: Pulmonary effort is normal. No accessory muscle usage or respiratory distress.     Breath sounds: Normal breath sounds.  Chest:     Breasts:        Right: Normal.        Left: Normal.  Abdominal:     General: Bowel sounds are normal.     Palpations: Abdomen is soft. There is no hepatomegaly or splenomegaly.     Tenderness: There is no abdominal tenderness.  Musculoskeletal:         General: Normal range of motion.     Cervical back: Normal range of motion and neck supple.     Right lower leg: No edema.     Left lower leg: No edema.  Lymphadenopathy:     Head:     Right side of head: No submental, submandibular, tonsillar, preauricular or posterior auricular adenopathy.     Left side of head: No submental, submandibular, tonsillar, preauricular or posterior auricular adenopathy.     Cervical: No cervical adenopathy.     Upper Body:     Right upper body: No supraclavicular, axillary or pectoral adenopathy.     Left upper body: No supraclavicular, axillary or pectoral adenopathy.  Skin:    General: Skin is warm and dry.     Capillary Refill: Capillary refill takes less than 2 seconds.     Findings: No rash.  Neurological:     Mental Status: She is alert and oriented to person, place, and time.     Cranial Nerves: Cranial nerves are intact.     Gait: Gait is intact.     Deep Tendon Reflexes: Reflexes are normal and symmetric.     Reflex Scores:      Brachioradialis reflexes are 2+ on the right side and 2+ on the left side.      Patellar reflexes are 2+ on the right side and 2+ on the left side. Psychiatric:        Attention and Perception: Attention normal.        Mood and Affect: Mood normal.  Speech: Speech normal.        Behavior: Behavior normal. Behavior is cooperative.        Thought Content: Thought content normal.        Judgment: Judgment normal.    EKG IN OFFICE: NSR with rate 70 and normal axis  Results for orders placed or performed in visit on 04/25/19  HM COLONOSCOPY  Result Value Ref Range   HM Colonoscopy See Report (in chart) See Report (in chart), Patient Reported      Assessment & Plan:   Problem List Items Addressed This Visit      Other   Anxiety    Chronic, ongoing with recent exacerbation. EKG in office today NSR.  Will continue Celexa and add on Buspar to take as needed, script sent.  She denies SI/HI.  Discussed  meditation for anxiety.  Return in 6 weeks.      Relevant Medications   busPIRone (BUSPAR) 5 MG tablet   Other Relevant Orders   EKG 12-Lead (Completed)   TSH   Vitamin D deficiency    History of low level reported, requested Vit D level check.  Will check and initiate supplement as needed.      Relevant Orders   VITAMIN D 25 Hydroxy (Vit-D Deficiency, Fractures)   Obesity    Recommend continued focus on healthy diet choices and regular physical activity (30 minutes 5 days a week).  Focus on small goals at a time and set timeline to achieve.      Former smoker    Recommend continued cessation.  Smoked 1 PPD for 25 years and quit 3 years ago, not qualified for CT lung screening due to less than 30 years.       Other Visit Diagnoses    Annual physical exam    -  Primary   Annual physical labs to include TSH, CMP, CBC, lipid   Relevant Orders   CBC with Differential/Platelet   Comprehensive metabolic panel   Lipid Panel w/o Chol/HDL Ratio   TSH   Breast cancer screening by mammogram       Mammogram ordered   Relevant Orders   MM DIGITAL SCREENING BILATERAL       Follow up plan: Return in about 6 weeks (around 08/31/2019) for Mood.   LABORATORY TESTING:  - Pap smear: up to date  IMMUNIZATIONS:   - Tdap: Tetanus vaccination status reviewed: last tetanus booster within 10 years. - Influenza: Up to date - Pneumovax: Not applicable - Prevnar: Not applicable - HPV: Not applicable - Zostavax vaccine: Refused  SCREENING: -Mammogram: Ordered today  - Colonoscopy: Up to date  - Bone Density: Not applicable  -Hearing Test: Not applicable  -Spirometry: Not applicable   PATIENT COUNSELING:   Advised to take 1 mg of folate supplement per day if capable of pregnancy.   Sexuality: Discussed sexually transmitted diseases, partner selection, use of condoms, avoidance of unintended pregnancy  and contraceptive alternatives.   Advised to avoid cigarette smoking.  I  discussed with the patient that most people either abstain from alcohol or drink within safe limits (<=14/week and <=4 drinks/occasion for males, <=7/weeks and <= 3 drinks/occasion for females) and that the risk for alcohol disorders and other health effects rises proportionally with the number of drinks per week and how often a drinker exceeds daily limits.  Discussed cessation/primary prevention of drug use and availability of treatment for abuse.   Diet: Encouraged to adjust caloric intake to maintain  or achieve ideal  body weight, to reduce intake of dietary saturated fat and total fat, to limit sodium intake by avoiding high sodium foods and not adding table salt, and to maintain adequate dietary potassium and calcium preferably from fresh fruits, vegetables, and low-fat dairy products.    stressed the importance of regular exercise  Injury prevention: Discussed safety belts, safety helmets, smoke detector, smoking near bedding or upholstery.   Dental health: Discussed importance of regular tooth brushing, flossing, and dental visits.    NEXT PREVENTATIVE PHYSICAL DUE IN 1 YEAR. Return in about 6 weeks (around 08/31/2019) for Mood.

## 2019-07-20 NOTE — Patient Instructions (Signed)
Health Maintenance, Female Adopting a healthy lifestyle and getting preventive care are important in promoting health and wellness. Ask your health care provider about:  The right schedule for you to have regular tests and exams.  Things you can do on your own to prevent diseases and keep yourself healthy. What should I know about diet, weight, and exercise? Eat a healthy diet   Eat a diet that includes plenty of vegetables, fruits, low-fat dairy products, and lean protein.  Do not eat a lot of foods that are high in solid fats, added sugars, or sodium. Maintain a healthy weight Body mass index (BMI) is used to identify weight problems. It estimates body fat based on height and weight. Your health care provider can help determine your BMI and help you achieve or maintain a healthy weight. Get regular exercise Get regular exercise. This is one of the most important things you can do for your health. Most adults should:  Exercise for at least 150 minutes each week. The exercise should increase your heart rate and make you sweat (moderate-intensity exercise).  Do strengthening exercises at least twice a week. This is in addition to the moderate-intensity exercise.  Spend less time sitting. Even light physical activity can be beneficial. Watch cholesterol and blood lipids Have your blood tested for lipids and cholesterol at 64 years of age, then have this test every 5 years. Have your cholesterol levels checked more often if:  Your lipid or cholesterol levels are high.  You are older than 64 years of age.  You are at high risk for heart disease. What should I know about cancer screening? Depending on your health history and family history, you may need to have cancer screening at various ages. This may include screening for:  Breast cancer.  Cervical cancer.  Colorectal cancer.  Skin cancer.  Lung cancer. What should I know about heart disease, diabetes, and high blood  pressure? Blood pressure and heart disease  High blood pressure causes heart disease and increases the risk of stroke. This is more likely to develop in people who have high blood pressure readings, are of African descent, or are overweight.  Have your blood pressure checked: ? Every 3-5 years if you are 54-9 years of age. ? Every year if you are 69 years old or older. Diabetes Have regular diabetes screenings. This checks your fasting blood sugar level. Have the screening done:  Once every three years after age 36 if you are at a normal weight and have a low risk for diabetes.  More often and at a younger age if you are overweight or have a high risk for diabetes. What should I know about preventing infection? Hepatitis B If you have a higher risk for hepatitis B, you should be screened for this virus. Talk with your health care provider to find out if you are at risk for hepatitis B infection. Hepatitis C Testing is recommended for:  Everyone born from [birth year] through 1965.  Anyone with known risk factors for hepatitis C. Sexually transmitted infections (STIs)  Get screened for STIs, including gonorrhea and chlamydia, if: ? You are sexually active and are younger than 64 years of age. ? You are older than 64 years of age and your health care provider tells you that you are at risk for this type of infection. ? Your sexual activity has changed since you were last screened, and you are at increased risk for chlamydia or gonorrhea. Ask your health care provider  if you are at risk.  Ask your health care provider about whether you are at high risk for HIV. Your health care provider may recommend a prescription medicine to help prevent HIV infection. If you choose to take medicine to prevent HIV, you should first get tested for HIV. You should then be tested every 3 months for as long as you are taking the medicine. Pregnancy  If you are about to stop having your period (premenopausal) and  you may become pregnant, seek counseling before you get pregnant.  Take 400 to 800 micrograms (mcg) of folic acid every day if you become pregnant.  Ask for birth control (contraception) if you want to prevent pregnancy. Osteoporosis and menopause Osteoporosis is a disease in which the bones lose minerals and strength with aging. This can result in bone fractures. If you are 69 years old or older, or if you are at risk for osteoporosis and fractures, ask your health care provider if you should:  Be screened for bone loss.  Take a calcium or vitamin D supplement to lower your risk of fractures.  Be given hormone replacement therapy (HRT) to treat symptoms of menopause. Follow these instructions at home: Lifestyle  Do not use any products that contain nicotine or tobacco, such as cigarettes, e-cigarettes, and chewing tobacco. If you need help quitting, ask your health care provider.  Do not use street drugs.  Do not share needles.  Ask your health care provider for help if you need support or information about quitting drugs. Alcohol use  Do not drink alcohol if: ? Your health care provider tells you not to drink. ? You are pregnant, may be pregnant, or are planning to become pregnant.  If you drink alcohol: ? Limit how much you use to 0-1 drink a day. ? Limit intake if you are breastfeeding.  Be aware of how much alcohol is in your drink. In the [place] one drink equals one 12 oz bottle of beer (355 mL), one 5 oz glass of wine (148 mL), or one 1 oz glass of hard liquor (44 mL). General instructions  Schedule regular health, dental, and eye exams.  Stay current with your vaccines.  Tell your health care provider if: ? You often feel depressed. ? You have ever been abused or do not feel safe at home. Summary  Adopting a healthy lifestyle and getting preventive care are important in promoting health and wellness.  Follow your health care provider's instructions about healthy  diet, exercising, and getting tested or screened for diseases.  Follow your health care provider's instructions on monitoring your cholesterol and blood pressure. This information is not intended to replace advice given to you by your health care provider. Make sure you discuss any questions you have with your health care provider. Document Revised: 05/17/2018 Document Reviewed: 05/17/2018 Elsevier Patient Education  2020 [facility]) Exercise Recommendation  Being physically active is important to prevent heart disease and stroke, the nation's No. 1and No. 5killers. To improve overall cardiovascular health, we suggest at least 150 minutes per week of moderate exercise or 75 minutes per week of vigorous exercise (or a combination of moderate and vigorous activity). Thirty minutes a day, five times a week is an easy goal to remember. You will also experience benefits even if you divide your time into two or three segments of 10 to 15 minutes per day.  For people who would benefit from lowering their blood pressure or cholesterol, we  recommend 40 minutes of aerobic exercise of moderate to vigorous intensity three to four times a week to lower the risk for heart attack and stroke.  Physical activity is anything that makes you move your body and burn calories.  This includes things like climbing stairs or playing sports. Aerobic exercises benefit your heart, and include walking, jogging, swimming or biking. Strength and stretching exercises are best for overall stamina and flexibility.  The simplest, positive change you can make to effectively improve your heart health is to start walking. It's enjoyable, free, easy, social and great exercise. A walking program is flexible and boasts high success rates because people can stick with it. It's easy for walking to become a regular and satisfying part of life.   For Overall Cardiovascular Health:  At least 30 minutes  of moderate-intensity aerobic activity at least 5 days per week for a total of 150  OR   At least 25 minutes of vigorous aerobic activity at least 3 days per week for a total of 75 minutes; or a combination of moderate- and vigorous-intensity aerobic activity  AND   Moderate- to high-intensity muscle-strengthening activity at least 2 days per week for additional health benefits.  For Lowering Blood Pressure and Cholesterol  An average 40 minutes of moderate- to vigorous-intensity aerobic activity 3 or 4 times per week  What if I can't make it to the time goal? Something is always better than nothing! And everyone has to start somewhere. Even if you've been sedentary for years, today is the day you can begin to make healthy changes in your life. If you don't think you'll make it for 30 or 40 minutes, set a reachable goal for today. You can work up toward your overall goal by increasing your time as you get stronger. Don't let all-or-nothing thinking rob you of doing what you can every day.  Source:http://www.heart.org    

## 2019-07-20 NOTE — Assessment & Plan Note (Addendum)
Chronic, ongoing with recent exacerbation. EKG in office today NSR.  Will continue Celexa and add on Buspar to take as needed, script sent.  She denies SI/HI.  Discussed meditation for anxiety.  Return in 6 weeks.

## 2019-07-20 NOTE — Assessment & Plan Note (Signed)
Recommend continued focus on healthy diet choices and regular physical activity (30 minutes 5 days a week).  Focus on small goals at a time and set timeline to achieve.

## 2019-07-21 ENCOUNTER — Other Ambulatory Visit: Payer: Self-pay | Admitting: Nurse Practitioner

## 2019-07-21 LAB — TSH: TSH: 1.01 u[IU]/mL (ref 0.450–4.500)

## 2019-07-21 LAB — CBC WITH DIFFERENTIAL/PLATELET
Basophils Absolute: 0 10*3/uL (ref 0.0–0.2)
Basos: 1 %
EOS (ABSOLUTE): 0.1 10*3/uL (ref 0.0–0.4)
Eos: 1 %
Hematocrit: 41.1 % (ref 34.0–46.6)
Hemoglobin: 13.6 g/dL (ref 11.1–15.9)
Immature Grans (Abs): 0 10*3/uL (ref 0.0–0.1)
Immature Granulocytes: 0 %
Lymphocytes Absolute: 1.5 10*3/uL (ref 0.7–3.1)
Lymphs: 25 %
MCH: 28.2 pg (ref 26.6–33.0)
MCHC: 33.1 g/dL (ref 31.5–35.7)
MCV: 85 fL (ref 79–97)
Monocytes Absolute: 0.4 10*3/uL (ref 0.1–0.9)
Monocytes: 7 %
Neutrophils Absolute: 3.9 10*3/uL (ref 1.4–7.0)
Neutrophils: 66 %
Platelets: 221 10*3/uL (ref 150–450)
RBC: 4.83 x10E6/uL (ref 3.77–5.28)
RDW: 12.4 % (ref 11.7–15.4)
WBC: 5.9 10*3/uL (ref 3.4–10.8)

## 2019-07-21 LAB — COMPREHENSIVE METABOLIC PANEL
ALT: 17 IU/L (ref 0–32)
AST: 21 IU/L (ref 0–40)
Albumin/Globulin Ratio: 2 (ref 1.2–2.2)
Albumin: 4.3 g/dL (ref 3.8–4.8)
Alkaline Phosphatase: 87 IU/L (ref 39–117)
BUN/Creatinine Ratio: 16 (ref 12–28)
BUN: 12 mg/dL (ref 8–27)
Bilirubin Total: <0.2 mg/dL (ref 0.0–1.2)
CO2: 20 mmol/L (ref 20–29)
Calcium: 8.9 mg/dL (ref 8.7–10.3)
Chloride: 103 mmol/L (ref 96–106)
Creatinine, Ser: 0.75 mg/dL (ref 0.57–1.00)
GFR calc Af Amer: 98 mL/min/{1.73_m2} (ref >59)
GFR calc non Af Amer: 85 mL/min/{1.73_m2} (ref >59)
Globulin, Total: 2.1 g/dL (ref 1.5–4.5)
Glucose: 84 mg/dL (ref 65–99)
Potassium: 4.3 mmol/L (ref 3.5–5.2)
Sodium: 141 mmol/L (ref 134–144)
Total Protein: 6.4 g/dL (ref 6.0–8.5)

## 2019-07-21 LAB — LIPID PANEL W/O CHOL/HDL RATIO
Cholesterol, Total: 209 mg/dL — ABNORMAL HIGH (ref 100–199)
HDL: 70 mg/dL (ref >39)
LDL Chol Calc (NIH): 118 mg/dL — ABNORMAL HIGH (ref 0–99)
Triglycerides: 118 mg/dL (ref 0–149)
VLDL Cholesterol Cal: 21 mg/dL (ref 5–40)

## 2019-07-21 LAB — VITAMIN D 25 HYDROXY (VIT D DEFICIENCY, FRACTURES): Vit D, 25-Hydroxy: 12 ng/mL — ABNORMAL LOW (ref 30.0–100.0)

## 2019-07-21 MED ORDER — CHOLECALCIFEROL 1.25 MG (50000 UT) PO TABS: 1.0000 | ORAL_TABLET | ORAL | 0 refills | Status: DC

## 2019-07-21 NOTE — Progress Notes (Signed)
Contacted via MyChart The 10-year ASCVD risk score Mikey Bussing DC Jr., et al., 2013) is: 3.6%   Values used to calculate the score:     Age: 64 years     Sex: Female     Is Non-Hispanic African American: No     Diabetic: No     Tobacco smoker: No     Systolic Blood Pressure: 123456 mmHg     Is BP treated: No     HDL Cholesterol: 70 mg/dL     Total Cholesterol: 209 mg/dL

## 2019-07-21 NOTE — Progress Notes (Signed)
Low Vit D, script sent.

## 2019-08-24 ENCOUNTER — Telehealth: Payer: Self-pay | Admitting: Nurse Practitioner

## 2019-08-24 MED ORDER — CITALOPRAM HYDROBROMIDE 20 MG PO TABS: 20.0000 mg | ORAL_TABLET | Freq: Every day | ORAL | 3 refills | Status: DC

## 2019-08-24 NOTE — Telephone Encounter (Signed)
Patient requesting citalopram (CELEXA) 20 MG tablet refill, informed patient please allow 48 to 72 hour turn around time   [place] [place] [address] Phone:  [phone]  Fax:  [phone]

## 2019-08-24 NOTE — Telephone Encounter (Signed)
Called pt to let her know that rx has been sent to the pharmacy. Pt verbalized understanding

## 2019-08-24 NOTE — Telephone Encounter (Signed)
Routing to provider  

## 2019-08-24 NOTE — Telephone Encounter (Signed)
Script sent  

## 2019-08-31 ENCOUNTER — Other Ambulatory Visit: Payer: Self-pay

## 2019-08-31 ENCOUNTER — Ambulatory Visit (INDEPENDENT_AMBULATORY_CARE_PROVIDER_SITE_OTHER): Payer: 59 | Admitting: Nurse Practitioner

## 2019-08-31 ENCOUNTER — Encounter: Payer: Self-pay | Admitting: Nurse Practitioner

## 2019-08-31 VITALS — BP 107/69 | HR 84 | Temp 99.1°F | Ht 64.5 in | Wt 185.0 lb

## 2019-08-31 DIAGNOSIS — F5104 Psychophysiologic insomnia: Secondary | ICD-10-CM | POA: Diagnosis not present

## 2019-08-31 DIAGNOSIS — F419 Anxiety disorder, unspecified: Secondary | ICD-10-CM

## 2019-08-31 DIAGNOSIS — E559 Vitamin D deficiency, unspecified: Secondary | ICD-10-CM

## 2019-08-31 DIAGNOSIS — G4700 Insomnia, unspecified: Secondary | ICD-10-CM | POA: Insufficient documentation

## 2019-08-31 MED ORDER — QUETIAPINE FUMARATE 25 MG PO TABS: 25.0000 mg | ORAL_TABLET | Freq: Every day | ORAL | 2 refills | Status: DC

## 2019-08-31 NOTE — Patient Instructions (Signed)
May switch to daily Vitamin D3 1000 units.  Vitamin D Deficiency Vitamin D deficiency is when your body does not have enough vitamin D. Vitamin D is important to your body because:  It helps your body use other minerals.  It helps to keep your bones strong and healthy.  It may help to prevent some diseases.  It helps your heart and other muscles work well. Not getting enough vitamin D can make your bones soft. It can also cause other health problems. What are the causes? This condition may be caused by:  Not eating enough foods that contain vitamin D.  Not getting enough sun.  Having diseases that make it hard for your body to absorb vitamin D.  Having a surgery in which a part of the stomach or a part of the small intestine is removed.  Having kidney disease or liver disease. What increases the risk? You are more likely to get this condition if:  You are older.  You do not spend much time outdoors.  You live in a nursing home.  You have had broken bones.  You have weak or thin bones (osteoporosis).  You have a disease or condition that changes how your body absorbs vitamin D.  You have dark skin.  You take certain medicines.  You are overweight or obese. What are the signs or symptoms?  In mild cases, there may not be any symptoms. If the condition is very bad, symptoms may include: ? Bone pain. ? Muscle pain. ? Falling often. ? Broken bones caused by a minor injury. How is this treated? Treatment may include taking supplements as told by your doctor. Your doctor will tell you what dose is best for you. Supplements may include:  Vitamin D.  Calcium. Follow these instructions at home: Eating and drinking   Eat foods that contain vitamin D, such as: ? Dairy products, cereals, or juices with added vitamin D. Check the label. ? Fish, such as salmon or trout. ? Eggs. ? Oysters. ? Mushrooms. The items listed above may not be a complete list of what you  can eat and drink. Contact a dietitian for more options. General instructions  Take medicines and supplements only as told by your doctor.  Get regular, safe exposure to natural sunlight.  Do not use a tanning bed.  Maintain a healthy weight. Lose weight if needed.  Keep all follow-up visits as told by your doctor. This is important. How is this prevented?  You can get vitamin D by: ? Eating foods that naturally contain vitamin D. ? Eating or drinking products that have vitamin D added to them, such as cereals, juices, and milk. ? Taking vitamin D or a multivitamin that contains vitamin D. ? Being in the sun. Your body makes vitamin D when your skin is exposed to sunlight. Your body changes the sunlight into a form of the vitamin that it can use. Contact a doctor if:  Your symptoms do not go away.  You feel sick to your stomach (nauseous).  You throw up (vomit).  You poop less often than normal, or you have trouble pooping (constipation). Summary  Vitamin D deficiency is when your body does not have enough vitamin D.  Vitamin D helps to keep your bones strong and healthy.  This condition is often treated by taking a supplement.  Your doctor will tell you what dose is best for you. This information is not intended to replace advice given to you by your health  care provider. Make sure you discuss any questions you have with your health care provider. Document Revised: 01/30/2018 Document Reviewed: 01/30/2018 Elsevier Patient Education  [place].

## 2019-08-31 NOTE — Assessment & Plan Note (Addendum)
Chronic, stable, improvement with Buspar. GAD and PHQ9 improved. Will continue Celexa and Buspar to take as needed.  She denies SI/HI.  Discussed meditation for anxiety.  Will follow-up on mood in 6 months.

## 2019-08-31 NOTE — Assessment & Plan Note (Signed)
Ongoing.  Continue supplement and recheck Vit D level today.  If improved will go to daily Vitamin D3 100 units.

## 2019-08-31 NOTE — Assessment & Plan Note (Addendum)
New onset.  Suspect psychophysiologic.  Trazodone appears not to be covered by insurance.  Will trial Seroquel 25 MG at night, script sent.  Educated on medication.  Recommend focus on sleep hygiene techniques and educated on these.  Return in 6 weeks for follow-up.

## 2019-08-31 NOTE — Progress Notes (Signed)
BP 107/69   Pulse 84   Temp 99.1 F (37.3 C) (Oral)   Ht 5' 4.5" (1.638 m)   Wt 185 lb (83.9 kg)   SpO2 96%   BMI 31.26 kg/m    Subjective:    Patient ID: Pamela Barrett, female    DOB: 09/22/1955, 64 y.o.   MRN: [id number]  HPI: Pamela Barrett is a 64 y.o. female  Chief Complaint  Patient presents with  . Anxiety   ANXIETY/STRESS Continues on Celexa 20 MG and added Buspar last visit on 07/20/2019, using it every day once a day.  This is helping with anxiety. Duration:stable Anxious mood: no  Excessive worrying: no Irritability: no  Sweating: no Nausea: no Palpitations:no Hyperventilation: no Panic attacks: no Agoraphobia: no  Obscessions/compulsions: no Depressed mood: no Depression screen [facility] 2/9 08/31/2019 07/20/2019 04/25/2019  Decreased Interest 0 0 0  Down, Depressed, Hopeless 0 0 0  PHQ - 2 Score 0 0 0  Altered sleeping 3 - 0  Tired, decreased energy 0 - 0  Change in appetite 0 - 0  Feeling bad or failure about yourself  0 - 0  Trouble concentrating 0 - 0  Moving slowly or fidgety/restless 0 - 0  Suicidal thoughts 0 - 0  PHQ-9 Score 3 - 0  Difficult doing work/chores Not difficult at all - Not difficult at all   Anhedonia: no Weight changes: no Insomnia: yes hard to fall asleep  Hypersomnia: no Fatigue/loss of energy: no Feelings of worthlessness: no Feelings of guilt: no Impaired concentration/indecisiveness: no Suicidal ideations: no  Crying spells: no Recent Stressors/Life Changes: no   Relationship problems: no   Family stress: no     Financial stress: no    Job stress: no    Recent death/loss: no GAD 7 : Generalized Anxiety Score 08/31/2019 04/25/2019  Nervous, Anxious, on Edge 0 0  Control/stop worrying 0 0  Worry too much - different things 0 0  Trouble relaxing 0 0  Restless 0 0  Easily annoyed or irritable 0 0  Afraid - awful might happen 0 0  Total GAD 7 Score 0 0  Anxiety Difficulty Not difficult at all Not difficult at  all     INSOMNIA Has tried Melatonin, sleep machine, and staying awake until falls asleep.   Duration: chronic Satisfied with sleep quality: no Difficulty falling asleep: yes Difficulty staying asleep: yes Waking a few hours after sleep onset: yes Early morning awakenings: no Daytime hypersomnolence: no Wakes feeling refreshed: no Good sleep hygiene: yes Apnea: no Snoring: no Depressed/anxious mood: yes Recent stress: no Restless legs/nocturnal leg cramps: no Chronic pain/arthritis: no History of sleep study: no Treatments attempted: melatonin   VITAMIN D DEFICIENCY: Noted on recent physical -- 12.  Taking weekly supplement.  No recent falls/fractures or muscle pain.  Relevant past medical, surgical, family and social history reviewed and updated as indicated. Interim medical history since our last visit reviewed. Allergies and medications reviewed and updated.  Review of Systems  Constitutional: Negative for activity change, appetite change, diaphoresis, fatigue and fever.  Respiratory: Negative for cough, chest tightness and shortness of breath.   Cardiovascular: Negative for chest pain, palpitations and leg swelling.  Gastrointestinal: Negative.   Neurological: Negative.   Psychiatric/Behavioral: Positive for sleep disturbance. Negative for decreased concentration, self-injury and suicidal ideas. The patient is not nervous/anxious and is not hyperactive.     Per HPI unless specifically indicated above     Objective:    BP  107/69   Pulse 84   Temp 99.1 F (37.3 C) (Oral)   Ht 5' 4.5" (1.638 m)   Wt 185 lb (83.9 kg)   SpO2 96%   BMI 31.26 kg/m   Wt Readings from Last 3 Encounters:  08/31/19 185 lb (83.9 kg)  07/20/19 186 lb (84.4 kg)  04/25/19 175 lb 3.2 oz (79.5 kg)    Physical Exam Vitals and nursing note reviewed.  Constitutional:      General: She is awake. She is not in acute distress.    Appearance: She is well-developed, well-groomed and  overweight. She is not ill-appearing.  HENT:     Head: Normocephalic.     Right Ear: Hearing normal.     Left Ear: Hearing normal.  Eyes:     General: Lids are normal.        Right eye: No discharge.        Left eye: No discharge.     Conjunctiva/sclera: Conjunctivae normal.     Pupils: Pupils are equal, round, and reactive to light.  Neck:     Vascular: No carotid bruit.  Cardiovascular:     Rate and Rhythm: Normal rate and regular rhythm.     Heart sounds: Normal heart sounds. No murmur. No gallop.   Pulmonary:     Effort: Pulmonary effort is normal. No accessory muscle usage or respiratory distress.     Breath sounds: Normal breath sounds.  Abdominal:     General: Bowel sounds are normal.     Palpations: Abdomen is soft.  Musculoskeletal:     Cervical back: Normal range of motion and neck supple.     Right lower leg: No edema.     Left lower leg: No edema.  Skin:    General: Skin is warm and dry.  Neurological:     Mental Status: She is alert and oriented to person, place, and time.  Psychiatric:        Attention and Perception: Attention normal.        Mood and Affect: Mood normal.        Speech: Speech normal.        Behavior: Behavior normal. Behavior is cooperative.     Results for orders placed or performed in visit on 07/20/19  CBC with Differential/Platelet  Result Value Ref Range   WBC 5.9 3.4 - 10.8 x10E3/uL   RBC 4.83 3.77 - 5.28 x10E6/uL   Hemoglobin 13.6 11.1 - 15.9 g/dL   Hematocrit 41.1 34.0 - 46.6 %   MCV 85 79 - 97 fL   MCH 28.2 26.6 - 33.0 pg   MCHC 33.1 31.5 - 35.7 g/dL   RDW 12.4 11.7 - 15.4 %   Platelets 221 150 - 450 x10E3/uL   Neutrophils 66 Not Estab. %   Lymphs 25 Not Estab. %   Monocytes 7 Not Estab. %   Eos 1 Not Estab. %   Basos 1 Not Estab. %   Neutrophils Absolute 3.9 1.4 - 7.0 x10E3/uL   Lymphocytes Absolute 1.5 0.7 - 3.1 x10E3/uL   Monocytes Absolute 0.4 0.1 - 0.9 x10E3/uL   EOS (ABSOLUTE) 0.1 0.0 - 0.4 x10E3/uL   Basophils  Absolute 0.0 0.0 - 0.2 x10E3/uL   Immature Granulocytes 0 Not Estab. %   Immature Grans (Abs) 0.0 0.0 - 0.1 x10E3/uL  Comprehensive metabolic panel  Result Value Ref Range   Glucose 84 65 - 99 mg/dL   BUN 12 8 - 27 mg/dL   Creatinine, Ser  0.75 0.57 - 1.00 mg/dL   GFR calc non Af Amer 85 >59 mL/min/1.73   GFR calc Af Amer 98 >59 mL/min/1.73   BUN/Creatinine Ratio 16 12 - 28   Sodium 141 134 - 144 mmol/L   Potassium 4.3 3.5 - 5.2 mmol/L   Chloride 103 96 - 106 mmol/L   CO2 20 20 - 29 mmol/L   Calcium 8.9 8.7 - 10.3 mg/dL   Total Protein 6.4 6.0 - 8.5 g/dL   Albumin 4.3 3.8 - 4.8 g/dL   Globulin, Total 2.1 1.5 - 4.5 g/dL   Albumin/Globulin Ratio 2.0 1.2 - 2.2   Bilirubin Total <0.2 0.0 - 1.2 mg/dL   Alkaline Phosphatase 87 39 - 117 IU/L   AST 21 0 - 40 IU/L   ALT 17 0 - 32 IU/L  Lipid Panel w/o Chol/HDL Ratio  Result Value Ref Range   Cholesterol, Total 209 (H) 100 - 199 mg/dL   Triglycerides 118 0 - 149 mg/dL   HDL 70 >39 mg/dL   VLDL Cholesterol Cal 21 5 - 40 mg/dL   LDL Chol Calc (NIH) 118 (H) 0 - 99 mg/dL  VITAMIN D 25 Hydroxy (Vit-D Deficiency, Fractures)  Result Value Ref Range   Vit D, 25-Hydroxy 12.0 (L) 30.0 - 100.0 ng/mL  TSH  Result Value Ref Range   TSH 1.010 0.450 - 4.500 uIU/mL      Assessment & Plan:   Problem List Items Addressed This Visit      Other   Anxiety    Chronic, stable, improvement with Buspar. GAD and PHQ9 improved. Will continue Celexa and Buspar to take as needed.  She denies SI/HI.  Discussed meditation for anxiety.  Will follow-up on mood in 6 months.      Vitamin D deficiency - Primary    Ongoing.  Continue supplement and recheck Vit D level today.  If improved will go to daily Vitamin D3 100 units.      Relevant Orders   VITAMIN D 25 Hydroxy (Vit-D Deficiency, Fractures)   Insomnia    New onset.  Suspect psychophysiologic.  Trazodone appears not to be covered by insurance.  Will trial Seroquel 25 MG at night, script sent.  Educated  on medication.  Recommend focus on sleep hygiene techniques and educated on these.  Return in 6 weeks for follow-up.           Follow up plan: Return in about 6 weeks (around 10/12/2019) for insomnia.

## 2019-09-01 LAB — VITAMIN D 25 HYDROXY (VIT D DEFICIENCY, FRACTURES): Vit D, 25-Hydroxy: 52.4 ng/mL (ref 30.0–100.0)

## 2019-09-01 NOTE — Progress Notes (Signed)
Contacted via MyChart

## 2019-10-05 ENCOUNTER — Ambulatory Visit
Admission: RE | Admit: 2019-10-05 | Discharge: 2019-10-05 | Disposition: A | Discharge status: Home / Self Care | Payer: 59 | Source: Ambulatory Visit | Attending: Family Medicine | Admitting: Family Medicine

## 2019-10-05 DIAGNOSIS — Z1231 Encounter for screening mammogram for malignant neoplasm of breast: Secondary | ICD-10-CM | POA: Insufficient documentation

## 2019-10-08 ENCOUNTER — Other Ambulatory Visit: Payer: Self-pay

## 2019-10-08 ENCOUNTER — Other Ambulatory Visit
Admission: RE | Admit: 2019-10-08 | Discharge: 2019-10-08 | Disposition: A | Discharge status: Home / Self Care | Payer: 59 | Source: Ambulatory Visit | Attending: Internal Medicine | Admitting: Internal Medicine

## 2019-10-08 DIAGNOSIS — Z01812 Encounter for preprocedural laboratory examination: Secondary | ICD-10-CM | POA: Diagnosis present

## 2019-10-08 DIAGNOSIS — Z20822 Contact with and (suspected) exposure to covid-19: Secondary | ICD-10-CM | POA: Diagnosis not present

## 2019-10-08 LAB — SARS CORONAVIRUS 2 (TAT 6-24 HRS): SARS Coronavirus 2: NEGATIVE

## 2019-10-09 ENCOUNTER — Encounter: Payer: Self-pay | Admitting: Internal Medicine

## 2019-10-09 NOTE — Progress Notes (Signed)
Contacted via MyChart

## 2019-10-10 ENCOUNTER — Other Ambulatory Visit: Payer: Self-pay

## 2019-10-10 ENCOUNTER — Ambulatory Visit
Admission: RE | Admit: 2019-10-10 | Discharge: 2019-10-10 | Disposition: A | Discharge status: Home / Self Care | Payer: 59 | Attending: Internal Medicine | Admitting: Internal Medicine

## 2019-10-10 ENCOUNTER — Ambulatory Visit: Payer: 59 | Admitting: Anesthesiology

## 2019-10-10 ENCOUNTER — Encounter
Admission: RE | Disposition: A | Discharge status: Home / Self Care | Payer: Self-pay | Source: Home / Self Care | Attending: Internal Medicine

## 2019-10-10 ENCOUNTER — Encounter: Payer: Self-pay | Admitting: Internal Medicine

## 2019-10-10 DIAGNOSIS — Z79899 Other long term (current) drug therapy: Secondary | ICD-10-CM | POA: Insufficient documentation

## 2019-10-10 DIAGNOSIS — K633 Ulcer of intestine: Secondary | ICD-10-CM | POA: Insufficient documentation

## 2019-10-10 DIAGNOSIS — Z1211 Encounter for screening for malignant neoplasm of colon: Secondary | ICD-10-CM | POA: Insufficient documentation

## 2019-10-10 DIAGNOSIS — K6389 Other specified diseases of intestine: Secondary | ICD-10-CM | POA: Insufficient documentation

## 2019-10-10 DIAGNOSIS — Z87891 Personal history of nicotine dependence: Secondary | ICD-10-CM | POA: Insufficient documentation

## 2019-10-10 DIAGNOSIS — Z86010 Personal history of colonic polyps: Secondary | ICD-10-CM | POA: Diagnosis present

## 2019-10-10 DIAGNOSIS — K529 Noninfective gastroenteritis and colitis, unspecified: Secondary | ICD-10-CM | POA: Diagnosis not present

## 2019-10-10 DIAGNOSIS — K5730 Diverticulosis of large intestine without perforation or abscess without bleeding: Secondary | ICD-10-CM | POA: Diagnosis not present

## 2019-10-10 DIAGNOSIS — Z7982 Long term (current) use of aspirin: Secondary | ICD-10-CM | POA: Insufficient documentation

## 2019-10-10 DIAGNOSIS — F419 Anxiety disorder, unspecified: Secondary | ICD-10-CM | POA: Diagnosis not present

## 2019-10-10 DIAGNOSIS — K641 Second degree hemorrhoids: Secondary | ICD-10-CM | POA: Insufficient documentation

## 2019-10-10 DIAGNOSIS — F329 Major depressive disorder, single episode, unspecified: Secondary | ICD-10-CM | POA: Insufficient documentation

## 2019-10-10 HISTORY — PX: COLONOSCOPY WITH PROPOFOL: SHX5780

## 2019-10-10 SURGERY — COLONOSCOPY WITH PROPOFOL
Anesthesia: General

## 2019-10-10 MED ORDER — PROPOFOL 10 MG/ML IV BOLUS
INTRAVENOUS | Status: AC
  Filled 2019-10-10: qty 20

## 2019-10-10 MED ORDER — LIDOCAINE HCL (CARDIAC) PF 100 MG/5ML IV SOSY
PREFILLED_SYRINGE | INTRAVENOUS | Status: DC | PRN
  Administered 2019-10-10: 25 mg via INTRAVENOUS

## 2019-10-10 MED ORDER — PROPOFOL 500 MG/50ML IV EMUL
INTRAVENOUS | Status: AC
  Filled 2019-10-10: qty 50

## 2019-10-10 MED ORDER — SODIUM CHLORIDE 0.9 % IV SOLN: INTRAVENOUS | Status: DC

## 2019-10-10 MED ORDER — PROPOFOL 10 MG/ML IV BOLUS
INTRAVENOUS | Status: DC | PRN
Start: 2019-10-10 — End: 2019-10-10
  Administered 2019-10-10: 20 mg via INTRAVENOUS
  Administered 2019-10-10: 30 mg via INTRAVENOUS
  Administered 2019-10-10: 20 mg via INTRAVENOUS
  Administered 2019-10-10: 30 mg via INTRAVENOUS
  Administered 2019-10-10: 20 mg via INTRAVENOUS

## 2019-10-10 MED ORDER — PROPOFOL 500 MG/50ML IV EMUL
INTRAVENOUS | Status: DC | PRN
  Administered 2019-10-10: 100 ug/kg/min via INTRAVENOUS

## 2019-10-10 NOTE — Transfer of Care (Signed)
Immediate Anesthesia Transfer of Care Note  Patient: Pamela Barrett  Procedure(s) Performed: COLONOSCOPY WITH PROPOFOL (N/A )  Patient Location: PACU  Anesthesia Type:MAC  Level of Consciousness: drowsy  Airway & Oxygen Therapy: Patient Spontanous Breathing and Patient connected to nasal cannula oxygen  Post-op Assessment: Report given to RN and Post -op Vital signs reviewed and stable  Post vital signs: Reviewed and stable  Last Vitals:  Vitals Value Taken Time  BP 89/65 10/10/19 1124  Temp 36 C 10/10/19 1123  Pulse 97 10/10/19 1124  Resp 12 10/10/19 1124  SpO2 95 % 10/10/19 1124  Vitals shown include unvalidated device data.  Last Pain:  Vitals:   10/10/19 1123  TempSrc: Temporal  PainSc: Asleep         Complications: No apparent anesthesia complications

## 2019-10-10 NOTE — Anesthesia Preprocedure Evaluation (Signed)
Anesthesia Evaluation  Patient identified by MRN, date of birth, ID band Patient awake    Reviewed: Allergy & Precautions, H&P , NPO status , Patient's Chart, lab work & pertinent test results, reviewed documented beta blocker date and time   Airway Mallampati: II   Neck ROM: full    Dental  (+) Poor Dentition   Pulmonary neg pulmonary ROS, former smoker,    Pulmonary exam normal        Cardiovascular Exercise Tolerance: Good negative cardio ROS Normal cardiovascular exam Rhythm:regular Rate:Normal     Neuro/Psych PSYCHIATRIC DISORDERS Anxiety Depression negative neurological ROS     GI/Hepatic Neg liver ROS, GERD  Medicated,  Endo/Other  negative endocrine ROS  Renal/GU negative Renal ROS  negative genitourinary   Musculoskeletal   Abdominal   Peds  Hematology negative hematology ROS (+)   Anesthesia Other Findings Past Medical History: 05/04/2016: Abnormal uterine bleeding (AUB)     Comment:  Last Assessment & Plan:   Appears to be AUB-P vs AUB-M;              likely cervical polyp visualized on speculum exam and EMB              performed  TVUS ordered  My suspicion is that this is               most likely a benign process. As she does have a 30 pack               year history, I counseled the patient extensively on               smoking cessation, as this is her biggest risk factor for              chronic disease.   Endometrial Biopsy Procedure Note                Urine p No date: Anxiety No date: Depression No date: GERD (gastroesophageal reflux disease) Past Surgical History: 20+ yrs ago: BREAST EXCISIONAL BIOPSY; Left     Comment:  neg No date: COLONOSCOPY WITH ESOPHAGOGASTRODUODENOSCOPY (EGD) 2000: ESOPHAGOGASTRIC FUNDOPLICATION BMI    Body Mass Index: 30.90 kg/m     Reproductive/Obstetrics negative OB ROS                             Anesthesia Physical Anesthesia  Plan  ASA: III  Anesthesia Plan: General   Post-op Pain Management:    Induction:   PONV Risk Score and Plan:   Airway Management Planned:   Additional Equipment:   Intra-op Plan:   Post-operative Plan:   Informed Consent: I have reviewed the patients History and Physical, chart, labs and discussed the procedure including the risks, benefits and alternatives for the proposed anesthesia with the patient or authorized representative who has indicated his/her understanding and acceptance.     Dental Advisory Given  Plan Discussed with: CRNA  Anesthesia Plan Comments:         Anesthesia Quick Evaluation

## 2019-10-10 NOTE — Interval H&P Note (Signed)
History and Physical Interval Note:  10/10/2019 11:05 AM  Pamela Barrett  has presented today for surgery, with the diagnosis of PERSONAL HX OF COLON POLYPS.  The various methods of treatment have been discussed with the patient and family. After consideration of risks, benefits and other options for treatment, the patient has consented to  Procedure(s): COLONOSCOPY WITH PROPOFOL (N/A) as a surgical intervention.  The patient's history has been reviewed, patient examined, no change in status, stable for surgery.  I have reviewed the patient's chart and labs.  Questions were answered to the patient's satisfaction.     [place], [place]

## 2019-10-10 NOTE — Op Note (Signed)
[facility] Patient Name: Pamela Barrett Procedure Date: 10/10/2019 10:56 AM MRN: [id number] Account #: [id number] Date of Birth: January 22, 1956 Admit Type: Outpatient Age: 64 Room: [facility] ENDO ROOM 3 Gender: Female Note Status: Finalized Procedure:             Colonoscopy Indications:           Surveillance: Personal history of adenomatous polyps                         on last colonoscopy > 5 years ago Providers:             Lorie Apley K. Raguel Kosloski MD, MD Medicines:             Propofol per Anesthesia Complications:         No immediate complications. Procedure:             Pre-Anesthesia Assessment:                        - The risks and benefits of the procedure and the                         sedation options and risks were discussed with the                         patient. All questions were answered and informed                         consent was obtained.                        - Patient identification and proposed procedure were                         verified prior to the procedure by the nurse. The                         procedure was verified in the procedure room.                        - ASA Grade Assessment: III - A patient with severe                         systemic disease.                        - After reviewing the risks and benefits, the patient                         was deemed in satisfactory condition to undergo the                         procedure.                        After obtaining informed consent, the colonoscope was                         passed under direct vision. Throughout the procedure,  the patient's blood pressure, pulse, and oxygen                         saturations were monitored continuously. The                         Colonoscope was introduced through the anus and                         advanced to the the terminal ileum, with                         identification of the  appendiceal orifice and IC                         valve. The colonoscopy was performed without                         difficulty. The patient tolerated the procedure well.                         The quality of the bowel preparation was good. The                         terminal ileum, ileocecal valve, appendiceal orifice,                         and rectum were photographed. Findings:      The perianal and digital rectal examinations were normal. Pertinent       negatives include normal sphincter tone and no palpable rectal lesions.      Non-bleeding internal hemorrhoids were found during retroflexion. The       hemorrhoids were Grade II (internal hemorrhoids that prolapse but reduce       spontaneously).      Patchy moderate inflammation characterized by mucus and aphthous       ulcerations was found in the ascending colon and in the cecum. Biopsies       were taken with a cold forceps for histology.      Normal mucosa was found in the rectum, in the sigmoid colon, in the       descending colon, in the transverse colon, in the mid ascending colon       and in the distal ascending colon.      Two biopsies were obtained with cold forceps for histology in the rectum.      Many small-mouthed diverticula were found in the sigmoid colon.      The exam was otherwise without abnormality.      The terminal ileum appeared normal. Impression:            - Non-bleeding internal hemorrhoids.                        - Patchy moderate inflammation was found in the                         ascending colon and in the cecum secondary to colitis.                         Biopsied.                        -  Normal mucosa in the rectum, in the sigmoid colon,                         in the descending colon, in the transverse colon, in                         the mid ascending colon and in the distal ascending                         colon.                        - Diverticulosis in the sigmoid colon.                         - The examination was otherwise normal.                        - Biopsies performed in the rectum. Recommendation:        - Patient has a contact number available for                         emergencies. The signs and symptoms of potential                         delayed complications were discussed with the patient.                         Return to normal activities tomorrow. Written                         discharge instructions were provided to the patient.                        - Resume previous diet.                        - Continue present medications.                        - Await pathology results.                        - Repeat colonoscopy in 5 years for surveillance.                        - Return to GI office in 4 weeks.                        - The findings and recommendations were discussed with                         the patient.                        - Please follow up with Effie Berkshire, PA-C in 6                         weeks Procedure Code(s):     --- Professional ---  45380, Colonoscopy, flexible; with biopsy, single or                         multiple Diagnosis Code(s):     --- Professional ---                        K57.30, Diverticulosis of large intestine without                         perforation or abscess without bleeding                        K64.1, Second degree hemorrhoids                        K52.9, Noninfective gastroenteritis and colitis,                         unspecified                        Z86.010, Personal history of colonic polyps CPT copyright 2019 [employer]. All rights reserved. The codes documented in this report are preliminary and upon coder review may  be revised to meet current compliance requirements. Efrain Sella MD, MD 10/10/2019 11:25:57 AM This report has been signed electronically. Number of Addenda: 0 Note Initiated On: 10/10/2019 10:56 AM Scope Withdrawal  Time: 0 hours 8 minutes 9 seconds  Total Procedure Duration: 0 hours 11 minutes 20 seconds  Estimated Blood Loss:  Estimated blood loss: none. Estimated blood loss: none.      [facility]

## 2019-10-10 NOTE — H&P (Signed)
Outpatient short stay form Pre-procedure 10/10/2019 11:04 AM Herold Salguero K. Alice Reichert, M.D.  Primary Physician: Marnee Guarneri, NP  Reason for visit:  Personal hx of colon polyps (TA - 2009).  History of present illness:                            Patient presents for colonoscopy for a personal hx of colon polyps. The patient denies abdominal pain, abnormal weight loss or rectal bleeding.      Current Facility-Administered Medications:  .  0.9 %  sodium chloride infusion, , Intravenous, Continuous, [place], Benay Pike, MD, Last Rate: 20 mL/hr at 10/10/19 1040, New Bag at 10/10/19 1040  Medications Prior to Admission  Medication Sig Dispense Refill Last Dose  . aspirin EC 81 MG tablet Take 81 mg by mouth daily.   10/09/2019 at 0730  . busPIRone (BUSPAR) 5 MG tablet Take 1 tablet (5 mg total) by mouth as needed (for anxiety). Take twice daily as needed by mouth for anxiety. 60 tablet 3 10/09/2019 at Unknown time  . Cholecalciferol 1.25 MG (50000 UT) TABS Take 1 tablet by mouth once a week. For 8 weeks and then stop.  Return to office for lab draw. 8 tablet 0 10/09/2019 at Unknown time  . QUEtiapine (SEROQUEL) 25 MG tablet Take 1 tablet (25 mg total) by mouth at bedtime. 30 tablet 2 Past Week at Unknown time  . citalopram (CELEXA) 20 MG tablet Take 1 tablet (20 mg total) by mouth daily. 90 tablet 3      No Known Allergies   Past Medical History:  Diagnosis Date  . Abnormal uterine bleeding (AUB) 05/04/2016   Last Assessment & Plan:   Appears to be AUB-P vs AUB-M; likely cervical polyp visualized on speculum exam and EMB performed  TVUS ordered  My suspicion is that this is most likely a benign process. As she does have a 30 pack year history, I counseled the patient extensively on smoking cessation, as this is her biggest risk factor for chronic disease.   Endometrial Biopsy Procedure Note  Urine p  . Anxiety   . Depression   . GERD (gastroesophageal reflux disease)     Review of systems:   Otherwise negative.    Physical Exam  Gen: Alert, oriented. Appears stated age.  HEENT: [place]/AT. PERRLA. Lungs: CTA, no wheezes. CV: RR nl S1, S2. Abd: soft, benign, no masses. BS+ Ext: No edema. Pulses 2+    Planned procedures: Proceed with colonoscopy. The patient understands the nature of the planned procedure, indications, risks, alternatives and potential complications including but not limited to bleeding, infection, perforation, damage to internal organs and possible oversedation/side effects from anesthesia. The patient agrees and gives consent to proceed.  Please refer to procedure notes for findings, recommendations and patient disposition/instructions.     Daeshon Grammatico K. Alice Reichert, M.D. Gastroenterology 10/10/2019  11:04 AM

## 2019-10-11 ENCOUNTER — Encounter: Payer: Self-pay | Admitting: *Deleted

## 2019-10-12 ENCOUNTER — Other Ambulatory Visit: Payer: Self-pay

## 2019-10-12 ENCOUNTER — Encounter: Payer: Self-pay | Admitting: Nurse Practitioner

## 2019-10-12 ENCOUNTER — Ambulatory Visit (INDEPENDENT_AMBULATORY_CARE_PROVIDER_SITE_OTHER): Payer: 59 | Admitting: Nurse Practitioner

## 2019-10-12 DIAGNOSIS — F5104 Psychophysiologic insomnia: Secondary | ICD-10-CM | POA: Diagnosis not present

## 2019-10-12 MED ORDER — QUETIAPINE FUMARATE 25 MG PO TABS: 25.0000 mg | ORAL_TABLET | Freq: Every day | ORAL | 4 refills | Status: DC

## 2019-10-12 NOTE — Assessment & Plan Note (Addendum)
Improved.  Suspect psychophysiologic.  Trazodone not covered by insurance.  Is tolerating Seroquel 25 MG at night, will continue this regimen at low dose.  Educated on medication.  Recommend focus on sleep hygiene techniques and educated on these.  Return in 6 months.

## 2019-10-12 NOTE — Progress Notes (Signed)
BP 115/83   Pulse 78   Temp 98.5 F (36.9 C) (Oral)   Wt 185 lb 12.8 oz (84.3 kg)   SpO2 97%   BMI 31.89 kg/m    Subjective:    Patient ID: Pamela Barrett, female    DOB: 1955-06-27, 64 y.o.   MRN: [id number]  HPI: Pamela Barrett is a 64 y.o. female  Chief Complaint  Patient presents with  . Insomnia    pt states she is doing well with the Seroquel   INSOMNIA Started Seroquel last visit, she reports tolerating well.  Is only waking up one time at night now.   Overall improved sleep pattern reported. Duration: chronic Satisfied with sleep quality: yes Difficulty falling asleep: no Difficulty staying asleep: only getting up once a night, improved Waking a few hours after sleep onset: no Early morning awakenings: yes Daytime hypersomnolence: no Wakes feeling refreshed: yes Good sleep hygiene: yes Apnea: no Snoring: no Depressed/anxious mood: no Recent stress: no Restless legs/nocturnal leg cramps: no Chronic pain/arthritis: no History of sleep study: no Treatments attempted: melatonin and benadryl   Relevant past medical, surgical, family and social history reviewed and updated as indicated. Interim medical history since our last visit reviewed. Allergies and medications reviewed and updated.  Review of Systems  Constitutional: Negative for activity change, appetite change, diaphoresis, fatigue and fever.  Respiratory: Negative for cough, chest tightness and shortness of breath.   Cardiovascular: Negative for chest pain, palpitations and leg swelling.  Gastrointestinal: Negative.   Neurological: Negative.   Psychiatric/Behavioral: Negative for decreased concentration, self-injury, sleep disturbance and suicidal ideas. The patient is not nervous/anxious.     Per HPI unless specifically indicated above     Objective:    BP 115/83   Pulse 78   Temp 98.5 F (36.9 C) (Oral)   Wt 185 lb 12.8 oz (84.3 kg)   SpO2 97%   BMI 31.89 kg/m   Wt Readings from  Last 3 Encounters:  10/12/19 185 lb 12.8 oz (84.3 kg)  10/10/19 180 lb (81.6 kg)  08/31/19 185 lb (83.9 kg)    Physical Exam Vitals and nursing note reviewed.  Constitutional:      General: She is awake. She is not in acute distress.    Appearance: She is well-developed. She is obese. She is not ill-appearing.  HENT:     Head: Normocephalic.     Right Ear: Hearing normal.     Left Ear: Hearing normal.  Eyes:     General: Lids are normal.        Right eye: No discharge.        Left eye: No discharge.     Conjunctiva/sclera: Conjunctivae normal.     Pupils: Pupils are equal, round, and reactive to light.  Neck:     Vascular: No carotid bruit.  Cardiovascular:     Rate and Rhythm: Normal rate and regular rhythm.     Heart sounds: Normal heart sounds. No murmur. No gallop.   Pulmonary:     Effort: Pulmonary effort is normal. No accessory muscle usage or respiratory distress.     Breath sounds: Normal breath sounds.  Abdominal:     General: Bowel sounds are normal.     Palpations: Abdomen is soft.  Musculoskeletal:     Cervical back: Normal range of motion and neck supple.     Right lower leg: No edema.     Left lower leg: No edema.  Skin:    General:  Skin is warm and dry.  Neurological:     Mental Status: She is alert and oriented to person, place, and time.  Psychiatric:        Attention and Perception: Attention normal.        Mood and Affect: Mood normal.        Speech: Speech normal.        Behavior: Behavior normal. Behavior is cooperative.        Thought Content: Thought content normal.    Results for orders placed or performed during the hospital encounter of 10/10/19  Surgical pathology  Result Value Ref Range   SURGICAL PATHOLOGY      SURGICAL PATHOLOGY CASE: [id number] PATIENT: [facility] Surgical Pathology Report     Specimen Submitted: A. Colon, cecum; cbx B. Colon, ascending; cbx C. Rectum; cbx  Clinical History: Personal HX of colon  polyps.  Internal hemorrhoids, mild colitis in cecum colon, diverticulosis    DIAGNOSIS: A. COLON, CECUM; COLD BIOPSY: - CHRONICALLY INFLAMED COLONIC TISSUE WITH MILD ACTIVITY (CRYPTITIS). - NEGATIVE FOR GRANULOMA, DYSPLASIA, AND MALIGNANCY.  B. COLON, ASCENDING; COLD BIOPSY: - CHRONICALLY INFLAMED COLONIC TISSUE WITH SEVERE ACTIVITY (CRYPTITIS, CRYPT ABSCESSES AND CRYPT DROPOUT). - NEGATIVE FOR GRANULOMA, DYSPLASIA, AND MALIGNANCY.  Comment: The concern for acute infectious colitis is noted. Pseudomembranes, as would be expected for C. difficile colitis, are not identified. The differential diagnosis includes acute viral colitis or inflammatory bowel disease. Immunohistochemical studies for CMV, HSV, and EBV are pending, and will be include d in an addendum.  C. RECTUM; COLD BIOPSY: - BENIGN RECTAL MUCOSA WITH NO SIGNIFICANT HISTOPATHOLOGIC CHANGE. - NEGATIVE FOR ACTIVE MUCOSAL PROCTITIS. - NEGATIVE FOR GRANULOMA, DYSPLASIA, AND MALIGNANCY.  GROSS DESCRIPTION: A. Labeled: C BX cecum colon mild colitis rule out C. difficile Received: Formalin Tissue fragment(s): Multiple Size: Aggregate, 0.7 x 0.3 x 0.1 cm Description: Tan soft tissue fragments Entirely submitted in 1 cassette.  B. Labeled: C BX ascending inflammation rule out IBD Received: Formalin Tissue fragment(s): 2 Size: 0.3 cm Description: Tan soft tissue fragments Entirely submitted in 1 cassette.  C. Labeled: C BX rectum normal tissues rule out proctitis Received: Formalin Tissue fragment(s): 1 Size: 0.5 cm Description: Tan soft tissue fragment Entirely submitted in 1 cassette.    Final Diagnosis performed by Allena Napoleon, MD.   Electronically signed 10/11/2019 10:48:03AM The electronic signature indicates that the named Attending Pat hologist has evaluated the specimen Technical component performed at [facility], [address], [place], [place] [address] Lab: [phone] Dir: Rush Farmer, MD, MMM   Professional component performed at [facility], [facility], [place], [place], [place] [address] Lab: [phone] Dir: Dellia Nims. Reuel Derby, MD       Assessment & Plan:   Problem List Items Addressed This Visit      Other   Insomnia    Improved.  Suspect psychophysiologic.  Trazodone not covered by insurance.  Is tolerating Seroquel 25 MG at night, will continue this regimen at low dose.  Educated on medication.  Recommend focus on sleep hygiene techniques and educated on these.  Return in 6 months.          Follow up plan: Return in about 6 months (around 04/13/2020) for MOOD.

## 2019-10-12 NOTE — Patient Instructions (Signed)

## 2019-10-15 NOTE — Anesthesia Postprocedure Evaluation (Signed)
Anesthesia Post Note  Patient: Pamela Barrett  Procedure(s) Performed: COLONOSCOPY WITH PROPOFOL (N/A )  Patient location during evaluation: PACU Anesthesia Type: General Level of consciousness: awake and alert Pain management: pain level controlled Vital Signs Assessment: post-procedure vital signs reviewed and stable Respiratory status: spontaneous breathing, nonlabored ventilation, respiratory function stable and patient connected to nasal cannula oxygen Cardiovascular status: blood pressure returned to baseline and stable Postop Assessment: no apparent nausea or vomiting Anesthetic complications: no     Last Vitals:  Vitals:   10/10/19 1023 10/10/19 1123  BP: (!) 134/99 (!) 89/65  Pulse: (!) 109   Resp: 20 18  Temp: (!) 35.9 C (!) 36 C  SpO2: 98%     Last Pain:  Vitals:   10/11/19 0736  TempSrc:   PainSc: 0-No pain                 Molli Barrows

## 2019-10-19 LAB — SURGICAL PATHOLOGY

## 2019-12-08 ENCOUNTER — Other Ambulatory Visit: Payer: Self-pay | Admitting: Nurse Practitioner

## 2020-04-18 ENCOUNTER — Ambulatory Visit (INDEPENDENT_AMBULATORY_CARE_PROVIDER_SITE_OTHER): Payer: 59 | Admitting: Nurse Practitioner

## 2020-04-18 ENCOUNTER — Other Ambulatory Visit: Payer: Self-pay

## 2020-04-18 ENCOUNTER — Encounter: Payer: Self-pay | Admitting: Nurse Practitioner

## 2020-04-18 VITALS — BP 117/71 | HR 79 | Temp 98.9°F

## 2020-04-18 DIAGNOSIS — Z23 Encounter for immunization: Secondary | ICD-10-CM

## 2020-04-18 DIAGNOSIS — E6609 Other obesity due to excess calories: Secondary | ICD-10-CM | POA: Diagnosis not present

## 2020-04-18 DIAGNOSIS — Z6831 Body mass index (BMI) 31.0-31.9, adult: Secondary | ICD-10-CM

## 2020-04-18 DIAGNOSIS — F419 Anxiety disorder, unspecified: Secondary | ICD-10-CM

## 2020-04-18 DIAGNOSIS — F5104 Psychophysiologic insomnia: Secondary | ICD-10-CM | POA: Diagnosis not present

## 2020-04-18 MED ORDER — QUETIAPINE FUMARATE 25 MG PO TABS: 25.0000 mg | ORAL_TABLET | Freq: Every day | ORAL | 4 refills | Status: DC

## 2020-04-18 NOTE — Assessment & Plan Note (Signed)
Chronic, stable. GAD and PHQ9 = 0.  Will continue Celexa and Buspar to take as needed.  She denies SI/HI.  Discussed meditation for anxiety.  Will follow-up on mood in 6 months for physical.

## 2020-04-18 NOTE — Progress Notes (Signed)
BP 117/71   Pulse 79   Temp 98.9 F (37.2 C) (Oral)   SpO2 97%    Subjective:    Patient ID: Pamela Barrett, female    DOB: 1956/02/27, 64 y.o.   MRN: [id number]  HPI: Pamela Barrett is a 64 y.o. female  Chief Complaint  Patient presents with  . Depression   ANXIETY/STRESS Continues on Celexa 20 MG and Buspar as needed, only occasionally.  Takes Seroquel at night on occasion for sleep. Duration:stable Anxious mood: no  Excessive worrying: no Irritability: no  Sweating: no Nausea: no Palpitations:no Hyperventilation: no Panic attacks: no Agoraphobia: no  Obscessions/compulsions: no Depressed mood: no Depression screen [facility] 2/9 04/18/2020 08/31/2019 07/20/2019 04/25/2019  Decreased Interest 0 0 0 0  Down, Depressed, Hopeless 0 0 0 0  PHQ - 2 Score 0 0 0 0  Altered sleeping 0 3 - 0  Tired, decreased energy 0 0 - 0  Change in appetite 0 0 - 0  Feeling bad or failure about yourself  0 0 - 0  Trouble concentrating 0 0 - 0  Moving slowly or fidgety/restless 0 0 - 0  Suicidal thoughts 0 0 - 0  PHQ-9 Score 0 3 - 0  Difficult doing work/chores Not difficult at all Not difficult at all - Not difficult at all  Anhedonia: no Weight changes: no Insomnia: yes hard to fall asleep  Hypersomnia: no Fatigue/loss of energy: no Feelings of worthlessness: no Feelings of guilt: no Impaired concentration/indecisiveness: no Suicidal ideations: no  Crying spells: no Recent Stressors/Life Changes: no   Relationship problems: no   Family stress: no     Financial stress: no    Job stress: no    Recent death/loss: no  GAD 7 : Generalized Anxiety Score 04/18/2020 08/31/2019 04/25/2019  Nervous, Anxious, on Edge 0 0 0  Control/stop worrying 0 0 0  Worry too much - different things 0 0 0  Trouble relaxing 0 0 0  Restless 0 0 0  Easily annoyed or irritable 0 0 0  Afraid - awful might happen 0 0 0  Total GAD 7 Score 0 0 0  Anxiety Difficulty Not difficult at all Not difficult  at all Not difficult at all    Relevant past medical, surgical, family and social history reviewed and updated as indicated. Interim medical history since our last visit reviewed. Allergies and medications reviewed and updated.  Review of Systems  Constitutional: Negative for activity change, appetite change, diaphoresis, fatigue and fever.  Respiratory: Negative for cough, chest tightness and shortness of breath.   Cardiovascular: Negative for chest pain, palpitations and leg swelling.  Gastrointestinal: Negative.   Neurological: Negative.   Psychiatric/Behavioral: Negative for decreased concentration, self-injury, sleep disturbance and suicidal ideas. The patient is not nervous/anxious.     Per HPI unless specifically indicated above     Objective:    BP 117/71   Pulse 79   Temp 98.9 F (37.2 C) (Oral)   SpO2 97%   Wt Readings from Last 3 Encounters:  10/12/19 185 lb 12.8 oz (84.3 kg)  10/10/19 180 lb (81.6 kg)  08/31/19 185 lb (83.9 kg)    Physical Exam Vitals and nursing note reviewed.  Constitutional:      General: She is awake. She is not in acute distress.    Appearance: She is well-developed. She is obese. She is not ill-appearing.  HENT:     Head: Normocephalic.     Right Ear: Hearing normal.  Left Ear: Hearing normal.  Eyes:     General: Lids are normal.        Right eye: No discharge.        Left eye: No discharge.     Conjunctiva/sclera: Conjunctivae normal.     Pupils: Pupils are equal, round, and reactive to light.  Neck:     Vascular: No carotid bruit.  Cardiovascular:     Rate and Rhythm: Normal rate and regular rhythm.     Heart sounds: Normal heart sounds. No murmur heard.  No gallop.   Pulmonary:     Effort: Pulmonary effort is normal. No accessory muscle usage or respiratory distress.     Breath sounds: Normal breath sounds.  Abdominal:     General: Bowel sounds are normal.     Palpations: Abdomen is soft.  Musculoskeletal:     Cervical  back: Normal range of motion and neck supple.     Right lower leg: No edema.     Left lower leg: No edema.  Skin:    General: Skin is warm and dry.  Neurological:     Mental Status: She is alert and oriented to person, place, and time.  Psychiatric:        Attention and Perception: Attention normal.        Mood and Affect: Mood normal.        Speech: Speech normal.        Behavior: Behavior normal. Behavior is cooperative.        Thought Content: Thought content normal.       Assessment & Plan:   Problem List Items Addressed This Visit      Other   Anxiety - Primary    Chronic, stable. GAD and PHQ9 = 0.  Will continue Celexa and Buspar to take as needed.  She denies SI/HI.  Discussed meditation for anxiety.  Will follow-up on mood in 6 months for physical.      Obesity    Recommended eating smaller high protein, low fat meals more frequently and exercising 30 mins a day 5 times a week with a goal of 10-15lb weight loss in the next 3 months. Patient voiced their understanding and motivation to adhere to these recommendations.       Insomnia    Chronic, stable.  Continue Seroquel 25 MG at night as needed, refills sent.  Educated on medication.  Recommend focus on sleep hygiene techniques and educated on these.  Return in 6 months for physical.       Other Visit Diagnoses    Need for influenza vaccination       Flu vaccine today   Relevant Orders   Flu Vaccine QUAD 36+ mos IM       Follow up plan: Return in about 3 months (around 07/19/2020) for Annual physical due 07/19/20 or after with pap.

## 2020-04-18 NOTE — Patient Instructions (Addendum)
Influenza (Flu) Vaccine (Inactivated or Recombinant): What You Need to Know 1. Why get vaccinated? Influenza vaccine can prevent influenza (flu). Flu is a contagious disease that spreads around the [place] every year, usually between October and May. Anyone can get the flu, but it is more dangerous for some people. Infants and young children, people 64 years of age and older, pregnant women, and people with certain health conditions or a weakened immune system are at greatest risk of flu complications. Pneumonia, bronchitis, sinus infections and ear infections are examples of flu-related complications. If you have a medical condition, such as heart disease, cancer or diabetes, flu can make it worse. Flu can cause fever and chills, sore throat, muscle aches, fatigue, cough, headache, and runny or stuffy nose. Some people may have vomiting and diarrhea, though this is more common in children than adults. Each year thousands of people in the [place] die from flu, and many more are hospitalized. Flu vaccine prevents millions of illnesses and flu-related visits to the doctor each year. 2. Influenza vaccine CDC recommends everyone 57 months of age and older get vaccinated every flu season. Children 6 months through 2 years of age may need 2 doses during a single flu season. Everyone else needs only 1 dose each flu season. It takes about 2 weeks for protection to develop after vaccination. There are many flu viruses, and they are always changing. Each year a new flu vaccine is made to protect against three or four viruses that are likely to cause disease in the upcoming flu season. Even when the vaccine doesn't exactly match these viruses, it may still provide some protection. Influenza vaccine does not cause flu. Influenza vaccine may be given at the same time as other vaccines. 3. Talk with your health care provider Tell your vaccine provider if the person getting the vaccine:  Has had an  allergic reaction after a previous dose of influenza vaccine, or has any severe, life-threatening allergies.  Has ever had Guillain-Barr Syndrome (also called GBS). In some cases, your health care provider may decide to postpone influenza vaccination to a future visit. People with minor illnesses, such as a cold, may be vaccinated. People who are moderately or severely ill should usually wait until they recover before getting influenza vaccine. Your health care provider can give you more information. 4. Risks of a vaccine reaction  Soreness, redness, and swelling where shot is given, fever, muscle aches, and headache can happen after influenza vaccine.  There may be a very small increased risk of Guillain-Barr Syndrome (GBS) after inactivated influenza vaccine (the flu shot). Young children who get the flu shot along with pneumococcal vaccine (PCV13), and/or DTaP vaccine at the same time might be slightly more likely to have a seizure caused by fever. Tell your health care provider if a child who is getting flu vaccine has ever had a seizure. People sometimes faint after medical procedures, including vaccination. Tell your provider if you feel dizzy or have vision changes or ringing in the ears. As with any medicine, there is a very remote chance of a vaccine causing a severe allergic reaction, other serious injury, or death. 5. What if there is a serious problem? An allergic reaction could occur after the vaccinated person leaves the clinic. If you see signs of a severe allergic reaction (hives, swelling of the face and throat, difficulty breathing, a fast heartbeat, dizziness, or weakness), call 9-1-1 and get the person to the nearest hospital. For other signs that  concern you, call your health care provider. Adverse reactions should be reported to the Vaccine Adverse Event Reporting System (VAERS). Your health care provider will usually file this report, or you can do it yourself. Visit the  VAERS website at www.vaers.SamedayNews.es or call (661) 416-1465.VAERS is only for reporting reactions, and VAERS staff do not give medical advice. 6. The National Vaccine Injury Compensation Program The [employer] Vaccine Injury Compensation Program (VICP) is a federal program that was created to compensate people who may have been injured by certain vaccines. Visit the VICP website at GoldCloset.com.ee or call (442)646-3625 to learn about the program and about filing a claim. There is a time limit to file a claim for compensation. 7. How can I learn more?  Ask your healthcare provider.  Call your local or state health department.  Contact the Centers for Disease Control and Prevention (CDC): ? Call (603)836-2735 (1-800-CDC-INFO) or ? Visit CDC's https://gibson.com/ Vaccine Information Statement (Interim) Inactivated Influenza Vaccine (01/19/2018) This information is not intended to replace advice given to you by your health care provider. Make sure you discuss any questions you have with your health care provider. Document Revised: 09/12/2018 Document Reviewed: 01/23/2018 Elsevier Patient Education  2020 [place] With Depression Everyone experiences occasional disappointment, sadness, and loss in their lives. When you are feeling down, blue, or sad for at least 2 weeks in a row, it may mean that you have depression. Depression can affect your thoughts and feelings, relationships, daily activities, and physical health. It is caused by changes in the way your brain functions. If you receive a diagnosis of depression, your health care provider will tell you which type of depression you have and what treatment options are available to you. If you are living with depression, there are ways to help you recover from it and also ways to prevent it from coming back. How to cope with lifestyle changes Coping with stress     Stress is your body's reaction to life changes and events,  both good and bad. Stressful situations may include:  Getting married.  The death of a spouse.  Losing a job.  Retiring.  Having a baby. Stress can last just a few hours or it can be ongoing. Stress can play a major role in depression, so it is important to learn both how to cope with stress and how to think about it differently. Talk with your health care provider or a counselor if you would like to learn more about stress reduction. He or she may suggest some stress reduction techniques, such as:  Music therapy. This can include creating music or listening to music. Choose music that you enjoy and that inspires you.  Mindfulness-based meditation. This kind of meditation can be done while sitting or walking. It involves being aware of your normal breaths, rather than trying to control your breathing.  Centering prayer. This is a kind of meditation that involves focusing on a spiritual word or phrase. Choose a word, phrase, or sacred image that is meaningful to you and that brings you peace.  Deep breathing. To do this, expand your stomach and inhale slowly through your nose. Hold your breath for 3-5 seconds, then exhale slowly, allowing your stomach muscles to relax.  Muscle relaxation. This involves intentionally tensing muscles then relaxing them. Choose a stress reduction technique that fits your lifestyle and personality. Stress reduction techniques take time and practice to develop. Set aside 5-15 minutes a day to do them. Therapists can offer training  in these techniques. The training may be covered by some insurance plans. Other things you can do to manage stress include:  Keeping a stress diary. This can help you learn what triggers your stress and ways to control your response.  Understanding what your limits are and saying no to requests or events that lead to a schedule that is too full.  Thinking about how you respond to certain situations. You may not be able to control  everything, but you can control how you react.  Adding humor to your life by watching funny films or TV shows.  Making time for activities that help you relax and not feeling guilty about spending your time this way.  Medicines Your health care provider may suggest certain medicines if he or she feels that they will help improve your condition. Avoid using alcohol and other substances that may prevent your medicines from working properly (may interact). It is also important to:  Talk with your pharmacist or health care provider about all the medicines that you take, their possible side effects, and what medicines are safe to take together.  Make it your goal to take part in all treatment decisions (shared decision-making). This includes giving input on the side effects of medicines. It is best if shared decision-making with your health care provider is part of your total treatment plan. If your health care provider prescribes a medicine, you may not notice the full benefits of it for 4-8 weeks. Most people who are treated for depression need to be on medicine for at least 6-12 months after they feel better. If you are taking medicines as part of your treatment, do not stop taking medicines without first talking to your health care provider. You may need to have the medicine slowly decreased (tapered) over time to decrease the risk of harmful side effects. Relationships Your health care provider may suggest family therapy along with individual therapy and drug therapy. While there may not be family problems that are causing you to feel depressed, it is still important to make sure your family learns as much as they can about your mental health. Having your family's support can help make your treatment successful. How to recognize changes in your condition Everyone has a different response to treatment for depression. Recovery from major depression happens when you have not had signs of major  depression for two months. This may mean that you will start to:  Have more interest in doing activities.  Feel less hopeless than you did 2 months ago.  Have more energy.  Overeat less often, or have better or improving appetite.  Have better concentration. Your health care provider will work with you to decide the next steps in your recovery. It is also important to recognize when your condition is getting worse. Watch for these signs:  Having fatigue or low energy.  Eating too much or too little.  Sleeping too much or too little.  Feeling restless, agitated, or hopeless.  Having trouble concentrating or making decisions.  Having unexplained physical complaints.  Feeling irritable, angry, or aggressive. Get help as soon as you or your family members notice these symptoms coming back. How to get support and help from others How to talk with friends and family members about your condition  Talking to friends and family members about your condition can provide you with one way to get support and guidance. Reach out to trusted friends or family members, explain your symptoms to them, and let them  know that you are working with a health care provider to treat your depression. Financial resources Not all insurance plans cover mental health care, so it is important to check with your insurance carrier. If paying for co-pays or counseling services is a problem, search for a local or county mental health care center. They may be able to offer public mental health care services at low or no cost when you are not able to see a private health care provider. If you are taking medicine for depression, you may be able to get the generic form, which may be less expensive. Some makers of prescription medicines also offer help to patients who cannot afford the medicines they need. Follow these instructions at home:   Get the right amount and quality of sleep.  Cut down on using caffeine,  tobacco, alcohol, and other potentially harmful substances.  Try to exercise, such as walking or lifting small weights.  Take over-the-counter and prescription medicines only as told by your health care provider.  Eat a healthy diet that includes plenty of vegetables, fruits, whole grains, low-fat dairy products, and lean protein. Do not eat a lot of foods that are high in solid fats, added sugars, or salt.  Keep all follow-up visits as told by your health care provider. This is important. Contact a health care provider if:  You stop taking your antidepressant medicines, and you have any of these symptoms: ? Nausea. ? Headache. ? Feeling lightheaded. ? Chills and body aches. ? Not being able to sleep (insomnia).  You or your friends and family think your depression is getting worse. Get help right away if:  You have thoughts of hurting yourself or others. If you ever feel like you may hurt yourself or others, or have thoughts about taking your own life, get help right away. You can go to your nearest emergency department or call:  Your local emergency services (911 in the [place]  A suicide crisis helpline, such as the [facility] at 920-468-1937. This is open 24-hours a day. Summary  If you are living with depression, there are ways to help you recover from it and also ways to prevent it from coming back.  Work with your health care team to create a management plan that includes counseling, stress management techniques, and healthy lifestyle habits. This information is not intended to replace advice given to you by your health care provider. Make sure you discuss any questions you have with your health care provider. Document Revised: 09/15/2018 Document Reviewed: 04/26/2016 Elsevier Patient Education  [place].

## 2020-04-18 NOTE — Assessment & Plan Note (Signed)
Recommended eating smaller high protein, low fat meals more frequently and exercising 30 mins a day 5 times a week with a goal of 10-15lb weight loss in the next 3 months. Patient voiced their understanding and motivation to adhere to these recommendations.  

## 2020-04-18 NOTE — Assessment & Plan Note (Signed)
Chronic, stable.  Continue Seroquel 25 MG at night as needed, refills sent.  Educated on medication.  Recommend focus on sleep hygiene techniques and educated on these.  Return in 6 months for physical.

## 2020-05-29 ENCOUNTER — Other Ambulatory Visit: Payer: Self-pay | Admitting: Nurse Practitioner

## 2020-06-06 DIAGNOSIS — U071 COVID-19: Secondary | ICD-10-CM

## 2020-06-06 HISTORY — DX: COVID-19: U07.1

## 2020-06-12 ENCOUNTER — Ambulatory Visit (INDEPENDENT_AMBULATORY_CARE_PROVIDER_SITE_OTHER): Payer: 59 | Admitting: Family Medicine

## 2020-06-12 ENCOUNTER — Encounter: Payer: Self-pay | Admitting: Family Medicine

## 2020-06-12 DIAGNOSIS — U071 COVID-19: Secondary | ICD-10-CM

## 2020-06-12 MED ORDER — ONDANSETRON 4 MG PO TBDP: 4.0000 mg | ORAL_TABLET | Freq: Three times a day (TID) | ORAL | 3 refills | Status: AC | PRN

## 2020-06-12 MED ORDER — NAPROXEN 500 MG PO TABS: 500.0000 mg | ORAL_TABLET | Freq: Two times a day (BID) | ORAL | 1 refills | Status: DC

## 2020-06-12 NOTE — Progress Notes (Signed)
There were no vitals taken for this visit.   Subjective:    Patient ID: Pamela Barrett, female    DOB: 07/15/1955, 65 y.o.   MRN: [id number]  HPI: Pamela Barrett is a 65 y.o. female  Chief Complaint  Patient presents with  . Covid Positive    Pt states she in day 8 of covid, pt is having headaches and nausea. Has tried otc medications and they dont seem to help.    She is on day 8 of COVID right now. Having bad headaches and a lot of nausea. She has been taking dramamine for nausea and it's not lasting long. She has not thrown up, but she is feeling really nauseous. Tylenol and ibuprofen for headaches but it also has not been helping. She has otherwise been feeling OK. No other fevers. Has been fatigued. No SOB. No coughing or wheezing. She is otherwise starting to feel a little better.   Relevant past medical, surgical, family and social history reviewed and updated as indicated. Interim medical history since our last visit reviewed. Allergies and medications reviewed and updated.  Review of Systems  Constitutional: Positive for fatigue. Negative for activity change, appetite change, chills, diaphoresis, fever and unexpected weight change.  HENT: Negative.   Respiratory: Negative.   Cardiovascular: Negative.   Gastrointestinal: Positive for vomiting. Negative for abdominal distention, abdominal pain, anal bleeding, blood in stool, constipation, diarrhea, nausea and rectal pain.  Musculoskeletal: Negative.   Neurological: Positive for headaches. Negative for dizziness, tremors, seizures, syncope, facial asymmetry, speech difficulty, weakness, light-headedness and numbness.  Psychiatric/Behavioral: Negative.     Per HPI unless specifically indicated above     Objective:    There were no vitals taken for this visit.  Wt Readings from Last 3 Encounters:  10/12/19 185 lb 12.8 oz (84.3 kg)  10/10/19 180 lb (81.6 kg)  08/31/19 185 lb (83.9 kg)    Physical Exam Vitals and  nursing note reviewed.  Pulmonary:     Effort: Pulmonary effort is normal. No respiratory distress.     Comments: Speaking in full sentences Neurological:     Mental Status: She is alert.  Psychiatric:        Mood and Affect: Mood normal.        Behavior: Behavior normal.        Thought Content: Thought content normal.        Judgment: Judgment normal.         Assessment & Plan:   Problem List Items Addressed This Visit   None   Visit Diagnoses    COVID-19    -  Primary   Will treat symptomatically with zofran and naproxen. OK to take tylenol as well. Continue symptomatic care. Call with any concerns. Continue to monitor.        Follow up plan: Return if symptoms worsen or fail to improve.   . This visit was completed via telephone due to the restrictions of the COVID-19 pandemic. All issues as above were discussed and addressed but no physical exam was performed. If it was felt that the patient should be evaluated in the office, they were directed there. The patient verbally consented to this visit. Patient was unable to complete an audio/visual visit due to Lack of equipment. Due to the catastrophic nature of the COVID-19 pandemic, this visit was done through audio contact only. . Location of the patient: home . Location of the provider: work . Those involved with this call:  .  Provider: Olevia Perches, DO . CMA: Rolley Sims, CMA . Front Desk/Registration: Harriet Pho  . Time spent on call: 21 minutes on the phone discussing health concerns. 30 minutes total spent in review of patient's record and preparation of their chart.

## 2020-07-12 ENCOUNTER — Other Ambulatory Visit: Payer: Self-pay | Admitting: Family Medicine

## 2020-07-25 ENCOUNTER — Other Ambulatory Visit: Payer: Self-pay

## 2020-07-25 ENCOUNTER — Encounter: Payer: Self-pay | Admitting: Nurse Practitioner

## 2020-07-25 ENCOUNTER — Ambulatory Visit (INDEPENDENT_AMBULATORY_CARE_PROVIDER_SITE_OTHER): Payer: 59 | Admitting: Nurse Practitioner

## 2020-07-25 VITALS — BP 125/87 | HR 88 | Temp 98.7°F | Ht 64.0 in | Wt 165.8 lb

## 2020-07-25 DIAGNOSIS — Z114 Encounter for screening for human immunodeficiency virus [HIV]: Secondary | ICD-10-CM | POA: Diagnosis not present

## 2020-07-25 DIAGNOSIS — E559 Vitamin D deficiency, unspecified: Secondary | ICD-10-CM

## 2020-07-25 DIAGNOSIS — Z0000 Encounter for general adult medical examination without abnormal findings: Secondary | ICD-10-CM | POA: Diagnosis not present

## 2020-07-25 DIAGNOSIS — Z1231 Encounter for screening mammogram for malignant neoplasm of breast: Secondary | ICD-10-CM

## 2020-07-25 DIAGNOSIS — Z1329 Encounter for screening for other suspected endocrine disorder: Secondary | ICD-10-CM

## 2020-07-25 DIAGNOSIS — F419 Anxiety disorder, unspecified: Secondary | ICD-10-CM

## 2020-07-25 DIAGNOSIS — Z13220 Encounter for screening for lipoid disorders: Secondary | ICD-10-CM

## 2020-07-25 DIAGNOSIS — Z1159 Encounter for screening for other viral diseases: Secondary | ICD-10-CM | POA: Diagnosis not present

## 2020-07-25 DIAGNOSIS — Z87891 Personal history of nicotine dependence: Secondary | ICD-10-CM | POA: Diagnosis not present

## 2020-07-25 DIAGNOSIS — F5104 Psychophysiologic insomnia: Secondary | ICD-10-CM

## 2020-07-25 DIAGNOSIS — Z6828 Body mass index (BMI) 28.0-28.9, adult: Secondary | ICD-10-CM

## 2020-07-25 MED ORDER — CITALOPRAM HYDROBROMIDE 20 MG PO TABS: ORAL_TABLET | ORAL | 4 refills | Status: DC

## 2020-07-25 NOTE — Assessment & Plan Note (Signed)
Chronic, stable.  Continue Seroquel 25 MG at night as needed, refills as needed.  Educated on medication.  Recommend focus on sleep hygiene techniques and educated on these.  Return in 6 months for follow-up.

## 2020-07-25 NOTE — Progress Notes (Signed)
BP 125/87   Pulse 88 Comment: apical  Temp 98.7 F (37.1 C) (Oral)   Ht 5\' 4"  (1.626 m)   Wt 165 lb 12.8 oz (75.2 kg)   SpO2 96%   BMI 28.46 kg/m    Subjective:    Patient ID: Pamela Barrett, female    DOB: [birth year], 65 y.o.   MRN: [id number]  HPI: Pamela Barrett is a 65 y.o. female presenting on 07/25/2020 for comprehensive medical examination. Current medical complaints include:none  She currently lives with: grandson -- is at [facility], major cyber security Menopausal Symptoms: no   ANXIETY/STRESS Continues on Celexa 20 MG and Buspar 5 MG as needed (has not taken in weeks) + Seroquel for sleep as needed.  Lost husband 12 years ago to massive MI.  She quit smoking 4 years ago, smoked for 25 years, 1 PPD.  Continues on Vitamin D daily for low levels in past. Duration:stable Anxious mood: no  Excessive worrying: no Irritability: no  Sweating: yes Nausea: no Palpitations:no Hyperventilation: no Panic attacks: no Agoraphobia: no  Obscessions/compulsions: no Depressed mood: no Depression screen [facility] 2/9 07/25/2020 04/18/2020 08/31/2019 07/20/2019 04/25/2019  Decreased Interest 0 0 0 0 0  Down, Depressed, Hopeless 0 0 0 0 0  PHQ - 2 Score 0 0 0 0 0  Altered sleeping 0 0 3 - 0  Tired, decreased energy 0 0 0 - 0  Change in appetite 0 0 0 - 0  Feeling bad or failure about yourself  0 0 0 - 0  Trouble concentrating 0 0 0 - 0  Moving slowly or fidgety/restless 0 0 0 - 0  Suicidal thoughts 0 0 0 - 0  PHQ-9 Score 0 0 3 - 0  Difficult doing work/chores - Not difficult at all Not difficult at all - Not difficult at all   Anhedonia: no Weight changes: no Insomnia: none Hypersomnia: no Fatigue/loss of energy: no Feelings of worthlessness: no Feelings of guilt: no Impaired concentration/indecisiveness: yes Suicidal ideations: no  Crying spells: no Recent Stressors/Life Changes: no   Relationship problems: no   Family stress: no     Financial stress: no    Job stress:  no    Recent death/loss: no  The patient does not have a history of falls. I did not complete a risk assessment for falls. A plan of care for falls was not documented.   Past Medical History:  Past Medical History:  Diagnosis Date  . Abnormal uterine bleeding (AUB) 05/04/2016   Last Assessment & Plan:   Appears to be AUB-P vs AUB-M; likely cervical polyp visualized on speculum exam and EMB performed  TVUS ordered  My suspicion is that this is most likely a benign process. As she does have a 30 pack year history, I counseled the patient extensively on smoking cessation, as this is her biggest risk factor for chronic disease.   Endometrial Biopsy Procedure Note  Urine p  . Anxiety   . COVID 06/06/2020  . Depression   . GERD (gastroesophageal reflux disease)     Surgical History:  Past Surgical History:  Procedure Laterality Date  . BREAST EXCISIONAL BIOPSY Left 20+ yrs ago   neg  . COLONOSCOPY WITH ESOPHAGOGASTRODUODENOSCOPY (EGD)    . COLONOSCOPY WITH PROPOFOL N/A 10/10/2019   Procedure: COLONOSCOPY WITH PROPOFOL;  Surgeon: Toledo, Benay Pike, MD;  Location: [facility] ENDOSCOPY;  Service: Gastroenterology;  Laterality: N/A;  . ESOPHAGOGASTRIC FUNDOPLICATION  7893    Medications:  Current Outpatient Medications  on File Prior to Visit  Medication Sig  . aspirin EC 81 MG tablet Take 81 mg by mouth daily.  . busPIRone (BUSPAR) 5 MG tablet Take 1 tablet (5 mg total) by mouth as needed (for anxiety). Take twice daily as needed by mouth for anxiety.  . naproxen (NAPROSYN) 500 MG tablet Take 1 tablet (500 mg total) by mouth 2 (two) times daily with a meal.  . ondansetron (ZOFRAN ODT) 4 MG disintegrating tablet Take 1-2 tablets (4-8 mg total) by mouth every 8 (eight) hours as needed for nausea or vomiting.  Marland Kitchen QUEtiapine (SEROQUEL) 25 MG tablet Take 1 tablet (25 mg total) by mouth at bedtime.  . cholecalciferol (VITAMIN D3) 25 MCG (1000 UNIT) tablet Take 1,000 Units by mouth daily.   No current  facility-administered medications on file prior to visit.    Allergies:  No Known Allergies  Social History:  Social History   Socioeconomic History  . Marital status: Widowed    Spouse name: Not on file  . Number of children: Not on file  . Years of education: Not on file  . Highest education level: Not on file  Occupational History  . Not on file  Tobacco Use  . Smoking status: Former Smoker    Types: Cigarettes    Quit date: 04/07/2017    Years since quitting: 3.3  . Smokeless tobacco: Never Used  Vaping Use  . Vaping Use: Every day  Substance and Sexual Activity  . Alcohol use: Not Currently  . Drug use: Never  . Sexual activity: Not Currently  Other Topics Concern  . Not on file  Social History Narrative  . Not on file   Social Determinants of Health   Financial Resource Strain: Not on file  Food Insecurity: Not on file  Transportation Needs: Not on file  Physical Activity: Not on file  Stress: Not on file  Social Connections: Not on file  Intimate Partner Violence: Not on file   Social History   Tobacco Use  Smoking Status Former Smoker  . Types: Cigarettes  . Quit date: 04/07/2017  . Years since quitting: 3.3  Smokeless Tobacco Never Used   Social History   Substance and Sexual Activity  Alcohol Use Not Currently    Family History:  Family History  Problem Relation Age of Onset  . Alzheimer's disease Father   . Cancer Sister        colon  . Skin cancer Brother   . Cancer Paternal Grandfather        colon  . Varicose Veins Son     Past medical history, surgical history, medications, allergies, family history and social history reviewed with patient today and changes made to appropriate areas of the chart.   Review of Systems - negative All other ROS negative except what is listed above and in the HPI.      Objective:    BP 125/87   Pulse 88 Comment: apical  Temp 98.7 F (37.1 C) (Oral)   Ht 5\' 4"  (1.626 m)   Wt 165 lb 12.8 oz  (75.2 kg)   SpO2 96%   BMI 28.46 kg/m   Wt Readings from Last 3 Encounters:  07/25/20 165 lb 12.8 oz (75.2 kg)  10/12/19 185 lb 12.8 oz (84.3 kg)  10/10/19 180 lb (81.6 kg)    Physical Exam Constitutional:      General: She is awake. She is not in acute distress.    Appearance: She is well-developed. She  is not ill-appearing.  HENT:     Head: Normocephalic and atraumatic.     Right Ear: Hearing, tympanic membrane, ear canal and external ear normal. No drainage.     Left Ear: Hearing, tympanic membrane, ear canal and external ear normal. No drainage.     Nose: Nose normal.     Right Sinus: No maxillary sinus tenderness or frontal sinus tenderness.     Left Sinus: No maxillary sinus tenderness or frontal sinus tenderness.     Mouth/Throat:     Mouth: Mucous membranes are moist.     Pharynx: Oropharynx is clear. Uvula midline. No pharyngeal swelling, oropharyngeal exudate or posterior oropharyngeal erythema.  Eyes:     General: Lids are normal.        Right eye: No discharge.        Left eye: No discharge.     Extraocular Movements: Extraocular movements intact.     Conjunctiva/sclera: Conjunctivae normal.     Pupils: Pupils are equal, round, and reactive to light.     Visual Fields: Right eye visual fields normal and left eye visual fields normal.  Neck:     Thyroid: No thyromegaly.     Vascular: No carotid bruit.     Trachea: Trachea normal.  Cardiovascular:     Rate and Rhythm: Normal rate and regular rhythm.     Heart sounds: Normal heart sounds. No murmur. No gallop.   Pulmonary:     Effort: Pulmonary effort is normal. No accessory muscle usage or respiratory distress.     Breath sounds: Normal breath sounds.  Chest:     Breasts:        Right: Normal.        Left: Normal.  Abdominal:     General: Bowel sounds are normal.     Palpations: Abdomen is soft. There is no hepatomegaly or splenomegaly.     Tenderness: There is no abdominal tenderness.  Musculoskeletal:         General: Normal range of motion.     Cervical back: Normal range of motion and neck supple.     Right lower leg: No edema.     Left lower leg: No edema.  Lymphadenopathy:     Head:     Right side of head: No submental, submandibular, tonsillar, preauricular or posterior auricular adenopathy.     Left side of head: No submental, submandibular, tonsillar, preauricular or posterior auricular adenopathy.     Cervical: No cervical adenopathy.     Upper Body:     Right upper body: No supraclavicular, axillary or pectoral adenopathy.     Left upper body: No supraclavicular, axillary or pectoral adenopathy.  Skin:    General: Skin is warm and dry.     Capillary Refill: Capillary refill takes less than 2 seconds.     Findings: No rash.  Neurological:     Mental Status: She is alert and oriented to person, place, and time.     Cranial Nerves: Cranial nerves are intact.     Gait: Gait is intact.     Deep Tendon Reflexes: Reflexes are normal and symmetric.     Reflex Scores:      Brachioradialis reflexes are 2+ on the right side and 2+ on the left side.      Patellar reflexes are 2+ on the right side and 2+ on the left side. Psychiatric:        Attention and Perception: Attention normal.  Mood and Affect: Mood normal.        Speech: Speech normal.        Behavior: Behavior normal. Behavior is cooperative.        Thought Content: Thought content normal.        Judgment: Judgment normal.   Assessment & Plan:   Problem List Items Addressed This Visit      Other   Anxiety - Primary    Chronic, stable.  PHQ9 = 0.  Will continue Celexa and Buspar to take as needed.  She denies SI/HI.  Discussed meditation for anxiety.  Will follow-up on mood in 6 months,  Refills sent.      Relevant Medications   citalopram (CELEXA) 20 MG tablet   Vitamin D deficiency    Ongoing.  Continue supplement and recheck Vit D level today.  Plan DEXA order next visit as will be 80.      Relevant Orders    VITAMIN D 25 Hydroxy (Vit-D Deficiency, Fractures)   BMI 28.0-28.9,adult    Praised for 20 pounds weight loss. Recommended eating smaller high protein, low fat meals more frequently and exercising 30 mins a day 5 times a week with a goal of 10-15lb weight loss in the next 3 months. Patient voiced their understanding and motivation to adhere to these recommendations.       Former smoker    Recommend continued cessation.  Smoked 1 PPD for 25 years and quit 3 years ago, not qualified for CT lung screening due to less than 30 years.      Relevant Orders   CBC with Differential/Platelet   Insomnia    Chronic, stable.  Continue Seroquel 25 MG at night as needed, refills as needed.  Educated on medication.  Recommend focus on sleep hygiene techniques and educated on these.  Return in 6 months for follow-up.       Other Visit Diagnoses    Encounter for screening mammogram for malignant neoplasm of breast       Mammogram ordered   Relevant Orders   MM DIGITAL SCREENING BILATERAL   Need for hepatitis C screening test       Hep C screening x 1 ordered per guideline recommendations.   Encounter for screening for HIV       HIV screening x 1 ordered per guideline recommendations.   Relevant Orders   HIV Antibody (routine testing w rflx)   Encounter for annual physical exam       Annual labs today to include CBC, CMP, TSH, lipid.  Health maintenance reviewed with patient.   Relevant Orders   Comprehensive metabolic panel   Hepatitis C antibody   Screening cholesterol level       Lipid panel today for screening.   Relevant Orders   Lipid Panel w/o Chol/HDL Ratio   Thyroid disorder screen       TSH on labs today   Relevant Orders   TSH       Follow up plan: Return in about 6 months (around 01/22/2021) for MOOD and VITAMIN D -- order DEXA.   LABORATORY TESTING:  - Pap smear: wishes to defer -- turns 49 in April and past paps have all been negative  IMMUNIZATIONS:   - Tdap: Tetanus  vaccination status reviewed: last tetanus booster within 10 years. - Influenza: Up to date - Pneumovax: Not applicable - Prevnar: Not applicable - HPV: Not applicable - Zostavax vaccine: wishes to think about this  SCREENING: -Mammogram: Ordered today  -  Colonoscopy: Up to date  - Bone Density: Not applicable  -Hearing Test: Not applicable  -Spirometry: Not applicable   PATIENT COUNSELING:   Advised to take 1 mg of folate supplement per day if capable of pregnancy.   Sexuality: Discussed sexually transmitted diseases, partner selection, use of condoms, avoidance of unintended pregnancy  and contraceptive alternatives.   Advised to avoid cigarette smoking.  I discussed with the patient that most people either abstain from alcohol or drink within safe limits (<=14/week and <=4 drinks/occasion for males, <=7/weeks and <= 3 drinks/occasion for females) and that the risk for alcohol disorders and other health effects rises proportionally with the number of drinks per week and how often a drinker exceeds daily limits.  Discussed cessation/primary prevention of drug use and availability of treatment for abuse.   Diet: Encouraged to adjust caloric intake to maintain  or achieve ideal body weight, to reduce intake of dietary saturated fat and total fat, to limit sodium intake by avoiding high sodium foods and not adding table salt, and to maintain adequate dietary potassium and calcium preferably from fresh fruits, vegetables, and low-fat dairy products.    Stressed the importance of regular exercise  Injury prevention: Discussed safety belts, safety helmets, smoke detector, smoking near bedding or upholstery.   Dental health: Discussed importance of regular tooth brushing, flossing, and dental visits.    NEXT PREVENTATIVE PHYSICAL DUE IN 1 YEAR. Return in about 6 months (around 01/22/2021) for MOOD and VITAMIN D -- order DEXA.

## 2020-07-25 NOTE — Assessment & Plan Note (Addendum)
Ongoing.  Continue supplement and recheck Vit D level today.  Plan DEXA order next visit as will be 72.

## 2020-07-25 NOTE — Assessment & Plan Note (Signed)
Chronic, stable.  PHQ9 = 0.  Will continue Celexa and Buspar to take as needed.  She denies SI/HI.  Discussed meditation for anxiety.  Will follow-up on mood in 6 months,  Refills sent.

## 2020-07-25 NOTE — Assessment & Plan Note (Signed)
Praised for 20 pounds weight loss. Recommended eating smaller high protein, low fat meals more frequently and exercising 30 mins a day 5 times a week with a goal of 10-15lb weight loss in the next 3 months. Patient voiced their understanding and motivation to adhere to these recommendations.

## 2020-07-25 NOTE — Assessment & Plan Note (Signed)
Recommend continued cessation.  Smoked 1 PPD for 25 years and quit 3 years ago, not qualified for CT lung screening due to less than 30 years. ?

## 2020-07-25 NOTE — Patient Instructions (Signed)
Zoster Vaccine, Recombinant injection [facility]) What is this medicine? ZOSTER VACCINE (ZOS ter vak SEEN) is a vaccine used to reduce the risk of getting shingles. This vaccine is not used to treat shingles or nerve pain from shingles. This medicine may be used for other purposes; ask your health care provider or pharmacist if you have questions. COMMON BRAND NAME(S): [facility] What should I tell my health care provider before I take this medicine? They need to know if you have any of these conditions:  cancer  immune system problems  an unusual or allergic reaction to Zoster vaccine, other medications, foods, dyes, or preservatives  pregnant or trying to get pregnant  breast-feeding How should I use this medicine? This vaccine is injected into a muscle. It is given by a health care provider. A copy of Vaccine Information Statements will be given before each vaccination. Be sure to read this information carefully each time. This sheet may change often. Talk to your health care provider about the use of this vaccine in children. This vaccine is not approved for use in children. Overdosage: If you think you have taken too much of this medicine contact a poison control center or emergency room at once. NOTE: This medicine is only for you. Do not share this medicine with others. What if I miss a dose? Keep appointments for follow-up (booster) doses. It is important not to miss your dose. Call your health care provider if you are unable to keep an appointment. What may interact with this medicine?  medicines that suppress your immune system  medicines to treat cancer  steroid medicines like prednisone or cortisone This list may not describe all possible interactions. Give your health care provider a list of all the medicines, herbs, non-prescription drugs, or dietary supplements you use. Also tell them if you smoke, drink alcohol, or use illegal drugs. Some items may interact with your  medicine. What should I watch for while using this medicine? Visit your health care provider regularly. This vaccine, like all vaccines, may not fully protect everyone. What side effects may I notice from receiving this medicine? Side effects that you should report to your doctor or health care professional as soon as possible:  allergic reactions (skin rash, itching or hives; swelling of the face, lips, or tongue)  trouble breathing Side effects that usually do not require medical attention (report these to your doctor or health care professional if they continue or are bothersome):  chills  headache  fever  nausea  pain, redness, or irritation at site where injected  tiredness  vomiting This list may not describe all possible side effects. Call your doctor for medical advice about side effects. You may report side effects to FDA at 1-800-FDA-1088. Where should I keep my medicine? This vaccine is only given by a health care provider. It will not be stored at home. NOTE: This sheet is a summary. It may not cover all possible information. If you have questions about this medicine, talk to your doctor, pharmacist, or health care provider.  2021 Elsevier/Gold Standard (2019-06-29 16:23:07)

## 2020-07-26 LAB — CBC WITH DIFFERENTIAL/PLATELET
Basophils Absolute: 0 10*3/uL (ref 0.0–0.2)
Basos: 1 %
EOS (ABSOLUTE): 0.1 10*3/uL (ref 0.0–0.4)
Eos: 2 %
Hematocrit: 40.9 % (ref 34.0–46.6)
Hemoglobin: 13.9 g/dL (ref 11.1–15.9)
Immature Grans (Abs): 0 10*3/uL (ref 0.0–0.1)
Immature Granulocytes: 0 %
Lymphocytes Absolute: 1.5 10*3/uL (ref 0.7–3.1)
Lymphs: 26 %
MCH: 29.2 pg (ref 26.6–33.0)
MCHC: 34 g/dL (ref 31.5–35.7)
MCV: 86 fL (ref 79–97)
Monocytes Absolute: 0.4 10*3/uL (ref 0.1–0.9)
Monocytes: 8 %
Neutrophils Absolute: 3.7 10*3/uL (ref 1.4–7.0)
Neutrophils: 63 %
Platelets: 225 10*3/uL (ref 150–450)
RBC: 4.76 x10E6/uL (ref 3.77–5.28)
RDW: 12.6 % (ref 11.7–15.4)
WBC: 5.8 10*3/uL (ref 3.4–10.8)

## 2020-07-26 LAB — COMPREHENSIVE METABOLIC PANEL
ALT: 14 IU/L (ref 0–32)
AST: 20 IU/L (ref 0–40)
Albumin/Globulin Ratio: 2 (ref 1.2–2.2)
Albumin: 4.3 g/dL (ref 3.8–4.8)
Alkaline Phosphatase: 77 IU/L (ref 44–121)
BUN/Creatinine Ratio: 18 (ref 12–28)
BUN: 13 mg/dL (ref 8–27)
Bilirubin Total: 0.2 mg/dL (ref 0.0–1.2)
CO2: 19 mmol/L — ABNORMAL LOW (ref 20–29)
Calcium: 9 mg/dL (ref 8.7–10.3)
Chloride: 105 mmol/L (ref 96–106)
Creatinine, Ser: 0.74 mg/dL (ref 0.57–1.00)
GFR calc Af Amer: 99 mL/min/{1.73_m2} (ref >59)
GFR calc non Af Amer: 86 mL/min/{1.73_m2} (ref >59)
Globulin, Total: 2.1 g/dL (ref 1.5–4.5)
Glucose: 87 mg/dL (ref 65–99)
Potassium: 4 mmol/L (ref 3.5–5.2)
Sodium: 139 mmol/L (ref 134–144)
Total Protein: 6.4 g/dL (ref 6.0–8.5)

## 2020-07-26 LAB — HIV ANTIBODY (ROUTINE TESTING W REFLEX): HIV Screen 4th Generation wRfx: NONREACTIVE

## 2020-07-26 LAB — LIPID PANEL W/O CHOL/HDL RATIO
Cholesterol, Total: 202 mg/dL — ABNORMAL HIGH (ref 100–199)
HDL: 67 mg/dL (ref >39)
LDL Chol Calc (NIH): 120 mg/dL — ABNORMAL HIGH (ref 0–99)
Triglycerides: 83 mg/dL (ref 0–149)
VLDL Cholesterol Cal: 15 mg/dL (ref 5–40)

## 2020-07-26 LAB — TSH: TSH: 0.903 u[IU]/mL (ref 0.450–4.500)

## 2020-07-26 LAB — VITAMIN D 25 HYDROXY (VIT D DEFICIENCY, FRACTURES): Vit D, 25-Hydroxy: 33.9 ng/mL (ref 30.0–100.0)

## 2020-07-26 LAB — HEPATITIS C ANTIBODY: Hep C Virus Ab: <0.1 s/co ratio (ref 0.0–0.9)

## 2020-07-26 NOTE — Progress Notes (Signed)
Contacted via MyChart The 10-year ASCVD risk score Pamela Barrett Jr., et al., 2013) is: 4.4%   Values used to calculate the score:     Age: 65 years     Sex: Female     Is Non-Hispanic African American: No     Diabetic: No     Tobacco smoker: No     Systolic Blood Pressure: 976 mmHg     Is BP treated: No     HDL Cholesterol: 67 mg/dL     Total Cholesterol: 202 mg/dL   Good morning Pamela Barrett, your labs have returned and overall look great!!  The only abnormality is the cholesterol levels.  Your cholesterol is still high, but continued recommendations to make lifestyle changes. Your LDL is above normal. The LDL is the bad cholesterol. Over time and in combination with inflammation and other factors, this contributes to plaque which in turn may lead to stroke and/or heart attack down the road. Sometimes high LDL is primarily genetic, and people might be eating all the right foods but still have high numbers. Other times, there is room for improvement in one's diet and eating healthier can bring this number down and potentially reduce one's risk of heart attack and/or stroke.   To reduce your LDL, Remember - more fruits and vegetables, more fish, and limit red meat and dairy products. More soy, nuts, beans, barley, lentils, oats and plant sterol ester enriched margarine instead of butter. I also encourage eliminating sugar and processed food. Remember, shop on the outside of the grocery store and visit your [employer]. If you would like to talk with me about dietary changes plus or minus medications for your cholesterol, please let me know. We should recheck your cholesterol in 6-12 months fasting.  Any questions? Keep being awesome!!  Thank you for allowing me to participate in your care. Kindest regards, Areej Tayler

## 2020-11-27 DIAGNOSIS — L570 Actinic keratosis: Secondary | ICD-10-CM | POA: Diagnosis not present

## 2020-11-27 DIAGNOSIS — L565 Disseminated superficial actinic porokeratosis (DSAP): Secondary | ICD-10-CM | POA: Diagnosis not present

## 2020-11-27 DIAGNOSIS — C44629 Squamous cell carcinoma of skin of left upper limb, including shoulder: Secondary | ICD-10-CM | POA: Diagnosis not present

## 2020-11-27 DIAGNOSIS — D485 Neoplasm of uncertain behavior of skin: Secondary | ICD-10-CM | POA: Diagnosis not present

## 2020-12-15 DIAGNOSIS — H25813 Combined forms of age-related cataract, bilateral: Secondary | ICD-10-CM | POA: Diagnosis not present

## 2020-12-18 DIAGNOSIS — C44629 Squamous cell carcinoma of skin of left upper limb, including shoulder: Secondary | ICD-10-CM | POA: Diagnosis not present

## 2021-01-23 ENCOUNTER — Ambulatory Visit: Payer: 59 | Admitting: Nurse Practitioner

## 2021-02-16 ENCOUNTER — Encounter: Payer: Self-pay | Admitting: Nurse Practitioner

## 2021-02-16 ENCOUNTER — Other Ambulatory Visit: Payer: Self-pay

## 2021-02-16 ENCOUNTER — Ambulatory Visit (INDEPENDENT_AMBULATORY_CARE_PROVIDER_SITE_OTHER): Payer: Medicare Other | Admitting: Nurse Practitioner

## 2021-02-16 VITALS — BP 118/79 | HR 76 | Temp 98.4°F | Ht 64.3 in | Wt 169.4 lb

## 2021-02-16 DIAGNOSIS — F419 Anxiety disorder, unspecified: Secondary | ICD-10-CM

## 2021-02-16 DIAGNOSIS — F5104 Psychophysiologic insomnia: Secondary | ICD-10-CM

## 2021-02-16 DIAGNOSIS — Z23 Encounter for immunization: Secondary | ICD-10-CM | POA: Diagnosis not present

## 2021-02-16 NOTE — Assessment & Plan Note (Signed)
Chronic, stable.  Continue Seroquel 25 MG at night as needed, refills as needed.  Educated on medication.  Recommend focus on sleep hygiene techniques and educated on these.  Return in 6 months for follow-up.

## 2021-02-16 NOTE — Assessment & Plan Note (Signed)
Chronic, stable.  PHQ9 = 0 and GAD7 = 0.  Will continue Celexa and Buspar to take as needed. She denies SI/HI.  Discussed meditation for anxiety.  Will follow-up on mood in 6 months.  Refills up to date.

## 2021-02-16 NOTE — Progress Notes (Signed)
BP 118/79   Pulse 76   Temp 98.4 F (36.9 C) (Oral)   Ht 5' 4.3" (1.633 m)   Wt 169 lb 6.4 oz (76.8 kg)   SpO2 97%   BMI 28.81 kg/m    Subjective:    Patient ID: Pamela Barrett, female    DOB: Jun 21, 1955, 65 y.o.   MRN: [id number]  HPI: Pamela Barrett is a 65 y.o. female  Chief Complaint  Patient presents with   Depression   ANXIETY/STRESS Continues on Celexa 20 MG and Buspar as needed, does not hardly ever use Buspar.  Takes Seroquel at night on occasion for sleep, takes it 3-4 times a week. Duration:stable Anxious mood: no  Excessive worrying: no Irritability: no  Sweating: no Nausea: no Palpitations:no Hyperventilation: no Panic attacks: no Agoraphobia: no  Obscessions/compulsions: no Depressed mood: no Depression screen [facility] 2/9 02/16/2021 07/25/2020 04/18/2020 08/31/2019 07/20/2019  Decreased Interest 0 0 0 0 0  Down, Depressed, Hopeless 0 0 0 0 0  PHQ - 2 Score 0 0 0 0 0  Altered sleeping 0 0 0 3 -  Tired, decreased energy 0 0 0 0 -  Change in appetite 0 0 0 0 -  Feeling bad or failure about yourself  0 0 0 0 -  Trouble concentrating 0 0 0 0 -  Moving slowly or fidgety/restless 0 0 0 0 -  Suicidal thoughts 0 0 0 0 -  PHQ-9 Score 0 0 0 3 -  Difficult doing work/chores Not difficult at all - Not difficult at all Not difficult at all -  Anhedonia: no Weight changes: no Insomnia: yes hard to fall asleep  Hypersomnia: no Fatigue/loss of energy: no Feelings of worthlessness: no Feelings of guilt: no Impaired concentration/indecisiveness: no Suicidal ideations: no  Crying spells: no Recent Stressors/Life Changes: no   Relationship problems: no   Family stress: no     Financial stress: no    Job stress: no    Recent death/loss: no   GAD 7 : Generalized Anxiety Score 02/16/2021 07/25/2020 04/18/2020 08/31/2019  Nervous, Anxious, on Edge 0 0 0 0  Control/stop worrying 0 - 0 0  Worry too much - different things 0 0 0 0  Trouble relaxing 0 0 0 0   Restless 0 0 0 0  Easily annoyed or irritable 0 0 0 0  Afraid - awful might happen 0 0 0 0  Total GAD 7 Score 0 - 0 0  Anxiety Difficulty Not difficult at all - Not difficult at all Not difficult at all    Relevant past medical, surgical, family and social history reviewed and updated as indicated. Interim medical history since our last visit reviewed. Allergies and medications reviewed and updated.  Review of Systems  Constitutional:  Negative for activity change, appetite change, diaphoresis, fatigue and fever.  Respiratory:  Negative for cough, chest tightness and shortness of breath.   Cardiovascular:  Negative for chest pain, palpitations and leg swelling.  Gastrointestinal: Negative.   Neurological: Negative.   Psychiatric/Behavioral:  Negative for decreased concentration, self-injury, sleep disturbance and suicidal ideas. The patient is not nervous/anxious.    Per HPI unless specifically indicated above     Objective:    BP 118/79   Pulse 76   Temp 98.4 F (36.9 C) (Oral)   Ht 5' 4.3" (1.633 m)   Wt 169 lb 6.4 oz (76.8 kg)   SpO2 97%   BMI 28.81 kg/m   Wt Readings from Last 3  Encounters:  02/16/21 169 lb 6.4 oz (76.8 kg)  07/25/20 165 lb 12.8 oz (75.2 kg)  10/12/19 185 lb 12.8 oz (84.3 kg)    Physical Exam Vitals and nursing note reviewed.  Constitutional:      General: She is awake. She is not in acute distress.    Appearance: She is well-developed. She is obese. She is not ill-appearing.  HENT:     Head: Normocephalic.     Right Ear: Hearing normal.     Left Ear: Hearing normal.  Eyes:     General: Lids are normal.        Right eye: No discharge.        Left eye: No discharge.     Conjunctiva/sclera: Conjunctivae normal.     Pupils: Pupils are equal, round, and reactive to light.  Neck:     Vascular: No carotid bruit.  Cardiovascular:     Rate and Rhythm: Normal rate and regular rhythm.     Heart sounds: Normal heart sounds. No murmur heard.   No  gallop.  Pulmonary:     Effort: Pulmonary effort is normal. No accessory muscle usage or respiratory distress.     Breath sounds: Normal breath sounds.  Abdominal:     General: Bowel sounds are normal.     Palpations: Abdomen is soft.  Musculoskeletal:     Cervical back: Normal range of motion and neck supple.     Right lower leg: No edema.     Left lower leg: No edema.  Skin:    General: Skin is warm and dry.  Neurological:     Mental Status: She is alert and oriented to person, place, and time.  Psychiatric:        Attention and Perception: Attention normal.        Mood and Affect: Mood normal.        Speech: Speech normal.        Behavior: Behavior normal. Behavior is cooperative.        Thought Content: Thought content normal.      Assessment & Plan:   Problem List Items Addressed This Visit       Other   Anxiety - Primary    Chronic, stable.  PHQ9 = 0 and GAD7 = 0.  Will continue Celexa and Buspar to take as needed. She denies SI/HI.  Discussed meditation for anxiety.  Will follow-up on mood in 6 months.  Refills up to date.      Insomnia    Chronic, stable.  Continue Seroquel 25 MG at night as needed, refills as needed.  Educated on medication.  Recommend focus on sleep hygiene techniques and educated on these.  Return in 6 months for follow-up.      Other Visit Diagnoses     Influenza vaccination given       Flu vaccine provided today.   Relevant Orders   Flu Vaccine QUAD High Dose(Fluad)   Pneumococcal vaccination given       PPSV23 provided today.   Relevant Orders   Pneumococcal conjugate vaccine 13-valent        Follow up plan: Return in about 6 months (around 08/16/2021) for Annual physical due after 07/25/21.

## 2021-02-16 NOTE — Patient Instructions (Signed)
[facility]  Address: [address], [place], [place] [address]  Phone: [phone]   Bone Density Test A bone density test uses a type of X-ray to measure the amount of calcium and other minerals in a person's bones. It can measure bone density in the hip and the spine. The test is similar to having a regular X-ray. This test may also be called: Bone densitometry. Bone mineral density test. Dual-energy X-ray absorptiometry (DEXA). You may have this test to: Diagnose a condition that causes weak or thin bones (osteoporosis). Screen you for osteoporosis. Predict your risk for a broken bone (fracture). Determine how well your osteoporosis treatment is working. Tell a health care provider about: Any allergies you have. All medicines you are taking, including vitamins, herbs, eye drops, creams, and over-the-counter medicines. Any problems you or family members have had with anesthetic medicines. Any blood disorders you have. Any surgeries you have had. Any medical conditions you have. Whether you are pregnant or may be pregnant. Any medical tests you have had within the past 14 days that used contrast material. What are the risks? Generally, this is a safe test. However, it does expose you to a small amount of radiation, which can slightly increase your cancer risk. What happens before the test? Do not take any calcium supplements within the 24 hours before your test. You will need to remove all metal jewelry, eyeglasses, removable dental appliances, and any other metal objects on your body. What happens during the test?  You will lie down on an exam table. There will be an X-ray generator below you and an imaging device above you. Other devices, such as boxes or braces, may be used to position your body properly for the scan. The machine will slowly scan your body. You will need to keep very still while the machine does the scan. The images will  show up on a screen in the room. Images will be examined by a specialist after your test is finished. The procedure may vary among health care providers and hospitals. What can I expect after the test? It is up to you to get the results of your test. Ask your health care provider, or the department that is doing the test, when your results will be ready. Summary A bone density test is an imaging test that uses a type of X-ray to measure the amount of calcium and other minerals in your bones. The test may be used to diagnose or screen you for a condition that causes weak or thin bones (osteoporosis), predict your risk for a broken bone (fracture), or determine how well your osteoporosis treatment is working. Do not take any calcium supplements within 24 hours before your test. Ask your health care provider, or the department that is doing the test, when your results will be ready. This information is not intended to replace advice given to you by your health care provider. Make sure you discuss any questions you have with your health care provider. Document Revised: 11/08/2019 Document Reviewed: 11/08/2019 Elsevier Patient Education  [place].

## 2021-02-17 ENCOUNTER — Other Ambulatory Visit: Payer: Self-pay

## 2021-02-17 ENCOUNTER — Telehealth: Payer: Self-pay

## 2021-02-17 DIAGNOSIS — Z13820 Encounter for screening for osteoporosis: Secondary | ICD-10-CM

## 2021-02-17 NOTE — Telephone Encounter (Signed)
Copied from [place] [id number]. Topic: General - Other >> Feb 17, 2021  9:43 AM Bayard Beaver wrote: Reason for CRM: patient is trying to get a bone density test scheduled, was told she can do this at the mammogram facility. Please call back   Order placed. Called and let patient know that this was done for her.

## 2021-03-16 ENCOUNTER — Other Ambulatory Visit: Payer: Self-pay

## 2021-03-16 ENCOUNTER — Ambulatory Visit
Admission: RE | Admit: 2021-03-16 | Discharge: 2021-03-16 | Disposition: A | Discharge status: Home / Self Care | Payer: Medicare Other | Source: Ambulatory Visit | Attending: Nurse Practitioner | Admitting: Nurse Practitioner

## 2021-03-16 ENCOUNTER — Encounter: Payer: Self-pay | Admitting: Nurse Practitioner

## 2021-03-16 DIAGNOSIS — Z13820 Encounter for screening for osteoporosis: Secondary | ICD-10-CM | POA: Insufficient documentation

## 2021-03-16 DIAGNOSIS — Z780 Asymptomatic menopausal state: Secondary | ICD-10-CM | POA: Insufficient documentation

## 2021-03-16 DIAGNOSIS — M8589 Other specified disorders of bone density and structure, multiple sites: Secondary | ICD-10-CM | POA: Insufficient documentation

## 2021-03-16 DIAGNOSIS — M85851 Other specified disorders of bone density and structure, right thigh: Secondary | ICD-10-CM | POA: Diagnosis not present

## 2021-03-16 DIAGNOSIS — Z1231 Encounter for screening mammogram for malignant neoplasm of breast: Secondary | ICD-10-CM | POA: Insufficient documentation

## 2021-03-16 DIAGNOSIS — M85832 Other specified disorders of bone density and structure, left forearm: Secondary | ICD-10-CM | POA: Diagnosis not present

## 2021-03-16 NOTE — Progress Notes (Signed)
Contacted via MyChart   Your bone density shows thinning bones (osteopenia) but not brittle (osteoporosis). We recommend Vitamin D supplementation of about 2,0000 IUs of over the counter Vitamin D3. In addition, we recommend a diet high in calcium with dairy and dark green leafy vegetables. We would like you to get plenty of weight bearing exercises with walking and resistance training such as light weights or resistance bands available with instructions at places such as [employer].  Any questions? Keep being amazing!!  Thank you for allowing me to participate in your care.  I appreciate you. Kindest regards, Tavi Gaughran 

## 2021-03-19 NOTE — Progress Notes (Signed)
Contacted via MyChart   Normal mammogram, repeat in one year:)

## 2021-03-20 DIAGNOSIS — Z859 Personal history of malignant neoplasm, unspecified: Secondary | ICD-10-CM | POA: Diagnosis not present

## 2021-03-20 DIAGNOSIS — L821 Other seborrheic keratosis: Secondary | ICD-10-CM | POA: Diagnosis not present

## 2021-03-20 DIAGNOSIS — B078 Other viral warts: Secondary | ICD-10-CM | POA: Diagnosis not present

## 2021-03-20 DIAGNOSIS — L570 Actinic keratosis: Secondary | ICD-10-CM | POA: Diagnosis not present

## 2021-03-20 DIAGNOSIS — Q828 Other specified congenital malformations of skin: Secondary | ICD-10-CM | POA: Diagnosis not present

## 2021-03-20 DIAGNOSIS — D225 Melanocytic nevi of trunk: Secondary | ICD-10-CM | POA: Diagnosis not present

## 2021-03-20 DIAGNOSIS — L578 Other skin changes due to chronic exposure to nonionizing radiation: Secondary | ICD-10-CM | POA: Diagnosis not present

## 2021-03-30 ENCOUNTER — Ambulatory Visit: Payer: 59 | Admitting: Dermatology

## 2021-06-09 ENCOUNTER — Other Ambulatory Visit: Payer: Self-pay | Admitting: Nurse Practitioner

## 2021-06-09 NOTE — Telephone Encounter (Signed)
Requested medication (s) are due for refill today: no  Requested medication (s) are on the active medication list: yes  Last refill:  04/18/21 #90 with 4 refills  Future visit scheduled: yes  Notes to clinic:  Please review. Refill not delegated per protocol. Unable to approve or deny.     Requested Prescriptions  Pending Prescriptions Disp Refills   QUEtiapine (SEROQUEL) 25 MG tablet [Pharmacy Med Name: QUETIAPINE 25MG  TABLETS] 90 tablet 4    Sig: TAKE 1 TABLET(25 MG) BY MOUTH AT BEDTIME     Not Delegated - Psychiatry:  Antipsychotics - Second Generation (Atypical) - quetiapine Failed - 06/09/2021 10:15 AM      Failed - This refill cannot be delegated      Failed - ALT in normal range and within 180 days    ALT  Date Value Ref Range Status  07/25/2020 14 0 - 32 IU/L Final          Failed - AST in normal range and within 180 days    AST  Date Value Ref Range Status  07/25/2020 20 0 - 40 IU/L Final          Passed - Last BP in normal range    BP Readings from Last 1 Encounters:  02/16/21 118/79          Passed - Valid encounter within last 6 months    Recent Outpatient Visits           3 months ago [place], Barbaraann Faster, NP   10 months ago [place], [place], NP   12 months ago [place], [place], DO   1 year ago [place], Barbaraann Faster, NP   1 year ago Psychophysiological insomnia   [place], Barbaraann Faster, NP       Future Appointments             In 2 months [place], Barbaraann Faster, NP [employer]

## 2021-06-09 NOTE — Telephone Encounter (Signed)
Duplicate request. Unable to approve or deny per protocol.

## 2021-08-15 NOTE — Patient Instructions (Signed)
Healthy Eating ?Following a healthy eating pattern may help you to achieve and maintain a healthy body weight, reduce the risk of chronic disease, and live a long and productive life. It is important to follow a healthy eating pattern at an appropriate calorie level for your body. Your nutritional needs should be met primarily through food by choosing a variety of nutrient-rich foods. ?What are tips for following this plan? ?Reading food labels ?Read labels and choose the following: ?Reduced or low sodium. ?Juices with 100% fruit juice. ?Foods with low saturated fats and high polyunsaturated and monounsaturated fats. ?Foods with whole grains, such as whole wheat, cracked wheat, brown rice, and wild rice. ?Whole grains that are fortified with folic acid. This is recommended for women who are pregnant or who want to become pregnant. ?Read labels and avoid the following: ?Foods with a lot of added sugars. These include foods that contain brown sugar, corn sweetener, corn syrup, dextrose, fructose, glucose, high-fructose corn syrup, honey, invert sugar, lactose, malt syrup, maltose, molasses, raw sugar, sucrose, trehalose, or turbinado sugar. ?Do not eat more than the following amounts of added sugar per day: ?6 teaspoons (25 g) for women. ?9 teaspoons (38 g) for men. ?Foods that contain processed or refined starches and grains. ?Refined grain products, such as white flour, degermed cornmeal, white bread, and white rice. ?Shopping ?Choose nutrient-rich snacks, such as vegetables, whole fruits, and nuts. Avoid high-calorie and high-sugar snacks, such as potato chips, fruit snacks, and candy. ?Use oil-based dressings and spreads on foods instead of solid fats such as butter, stick margarine, or cream cheese. ?Limit pre-made sauces, mixes, and "instant" products such as flavored rice, instant noodles, and ready-made pasta. ?Try more plant-protein sources, such as tofu, tempeh, black beans, edamame, lentils, nuts, and  seeds. ?Explore eating plans such as the Mediterranean diet or vegetarian diet. ?Cooking ?Use oil to saut? or stir-fry foods instead of solid fats such as butter, stick margarine, or lard. ?Try baking, boiling, grilling, or broiling instead of frying. ?Remove the fatty part of meats before cooking. ?Steam vegetables in water or broth. ?Meal planning ? ?At meals, imagine dividing your plate into fourths: ?One-half of your plate is fruits and vegetables. ?One-fourth of your plate is whole grains. ?One-fourth of your plate is protein, especially lean meats, poultry, eggs, tofu, beans, or nuts. ?Include low-fat dairy as part of your daily diet. ?Lifestyle ?Choose healthy options in all settings, including home, work, school, restaurants, or stores. ?Prepare your food safely: ?Wash your hands after handling raw meats. ?Keep food preparation surfaces clean by regularly washing with hot, soapy water. ?Keep raw meats separate from ready-to-eat foods, such as fruits and vegetables. ?Cook seafood, meat, poultry, and eggs to the recommended internal temperature. ?Store foods at safe temperatures. In general: ?Keep cold foods at 40?F (4.4?C) or below. ?Keep hot foods at 140?F (60?C) or above. ?Keep your freezer at 0?F (-17.8?C) or below. ?Foods are no longer safe to eat when they have been between the temperatures of 40?-140?F (4.4-60?C) for more than 2 hours. ?What foods should I eat? ?Fruits ?Aim to eat 2 cup-equivalents of fresh, canned (in natural juice), or frozen fruits each day. Examples of 1 cup-equivalent of fruit include 1 small apple, 8 large strawberries, 1 cup canned fruit, ? cup dried fruit, or 1 cup 100% juice. ?Vegetables ?Aim to eat 2?-3 cup-equivalents of fresh and frozen vegetables each day, including different varieties and colors. Examples of 1 cup-equivalent of vegetables include 2 medium carrots, 2 cups raw,  leafy greens, 1 cup chopped vegetable (raw or cooked), or 1 medium baked potato. ?Grains ?Aim to  eat 6 ounce-equivalents of whole grains each day. Examples of 1 ounce-equivalent of grains include 1 slice of bread, 1 cup ready-to-eat cereal, 3 cups popcorn, or ? cup cooked rice, pasta, or cereal. ?Meats and other proteins ?Aim to eat 5-6 ounce-equivalents of protein each day. Examples of 1 ounce-equivalent of protein include 1 egg, 1/2 cup nuts or seeds, or 1 tablespoon (16 g) peanut butter. A cut of meat or fish that is the size of a deck of cards is about 3-4 ounce-equivalents. ?Of the protein you eat each week, try to have at least 8 ounces come from seafood. This includes salmon, trout, herring, and anchovies. ?Dairy ?Aim to eat 3 cup-equivalents of fat-free or low-fat dairy each day. Examples of 1 cup-equivalent of dairy include 1 cup (240 mL) milk, 8 ounces (250 g) yogurt, 1? ounces (44 g) natural cheese, or 1 cup (240 mL) fortified soy milk. ?Fats and oils ?Aim for about 5 teaspoons (21 g) per day. Choose monounsaturated fats, such as canola and olive oils, avocados, peanut butter, and most nuts, or polyunsaturated fats, such as sunflower, corn, and soybean oils, walnuts, pine nuts, sesame seeds, sunflower seeds, and flaxseed. ?Beverages ?Aim for six 8-oz glasses of water per day. Limit coffee to three to five 8-oz cups per day. ?Limit caffeinated beverages that have added calories, such as soda and energy drinks. ?Limit alcohol intake to no more than 1 drink a day for nonpregnant women and 2 drinks a day for men. One drink equals 12 oz of beer (355 mL), 5 oz of wine (148 mL), or 1? oz of hard liquor (44 mL). ?Seasoning and other foods ?Avoid adding excess amounts of salt to your foods. Try flavoring foods with herbs and spices instead of salt. ?Avoid adding sugar to foods. ?Try using oil-based dressings, sauces, and spreads instead of solid fats. ?This information is based on general [place] nutrition guidelines. For more information, visit [employer]. Exact amounts may vary based on your nutrition  needs. ?Summary ?A healthy eating plan may help you to maintain a healthy weight, reduce the risk of chronic diseases, and stay active throughout your life. ?Plan your meals. Make sure you eat the right portions of a variety of nutrient-rich foods. ?Try baking, boiling, grilling, or broiling instead of frying. ?Choose healthy options in all settings, including home, work, school, restaurants, or stores. ?This information is not intended to replace advice given to you by your health care provider. Make sure you discuss any questions you have with your health care provider. ?Document Revised: 01/20/2021 Document Reviewed: 01/20/2021 ?Elsevier Patient Education ? [place]. ? ?

## 2021-08-17 ENCOUNTER — Ambulatory Visit (INDEPENDENT_AMBULATORY_CARE_PROVIDER_SITE_OTHER): Payer: Medicare Other | Admitting: Nurse Practitioner

## 2021-08-17 ENCOUNTER — Encounter: Payer: Self-pay | Admitting: Nurse Practitioner

## 2021-08-17 ENCOUNTER — Other Ambulatory Visit: Payer: Self-pay

## 2021-08-17 VITALS — BP 118/72 | HR 91 | Temp 98.4°F | Ht 64.5 in | Wt 174.6 lb

## 2021-08-17 DIAGNOSIS — Z6828 Body mass index (BMI) 28.0-28.9, adult: Secondary | ICD-10-CM

## 2021-08-17 DIAGNOSIS — M85832 Other specified disorders of bone density and structure, left forearm: Secondary | ICD-10-CM | POA: Diagnosis not present

## 2021-08-17 DIAGNOSIS — Z0000 Encounter for general adult medical examination without abnormal findings: Secondary | ICD-10-CM | POA: Diagnosis not present

## 2021-08-17 DIAGNOSIS — F5104 Psychophysiologic insomnia: Secondary | ICD-10-CM

## 2021-08-17 DIAGNOSIS — Z136 Encounter for screening for cardiovascular disorders: Secondary | ICD-10-CM

## 2021-08-17 DIAGNOSIS — Z23 Encounter for immunization: Secondary | ICD-10-CM

## 2021-08-17 DIAGNOSIS — E7800 Pure hypercholesterolemia, unspecified: Secondary | ICD-10-CM

## 2021-08-17 DIAGNOSIS — Z87891 Personal history of nicotine dependence: Secondary | ICD-10-CM | POA: Diagnosis not present

## 2021-08-17 DIAGNOSIS — Z13220 Encounter for screening for lipoid disorders: Secondary | ICD-10-CM | POA: Diagnosis not present

## 2021-08-17 DIAGNOSIS — F419 Anxiety disorder, unspecified: Secondary | ICD-10-CM | POA: Diagnosis not present

## 2021-08-17 DIAGNOSIS — E559 Vitamin D deficiency, unspecified: Secondary | ICD-10-CM

## 2021-08-17 MED ORDER — SHINGRIX 50 MCG/0.5ML IM SUSR: 0.5000 mL | Freq: Once | INTRAMUSCULAR | 0 refills | Status: AC

## 2021-08-17 MED ORDER — CITALOPRAM HYDROBROMIDE 20 MG PO TABS: ORAL_TABLET | ORAL | 4 refills | Status: DC

## 2021-08-17 MED ORDER — BUSPIRONE HCL 5 MG PO TABS: 5.0000 mg | ORAL_TABLET | ORAL | 4 refills | Status: DC | PRN

## 2021-08-17 NOTE — Assessment & Plan Note (Signed)
Chronic, stable.  DEXA noted October 2022 = T-score -2.1.  Continue Vitamin D supplement and adequate Calcium intake daily.  Plan for repeat DEXA in 2027. ?

## 2021-08-17 NOTE — Assessment & Plan Note (Signed)
Recommend continued cessation.  Smoked 1 PPD for 25 years and quit 3 years ago, not qualified for CT lung screening due to less than 30 years. ?

## 2021-08-17 NOTE — Assessment & Plan Note (Signed)
Ongoing.  Continue supplement and recheck Vit D level today.  ?

## 2021-08-17 NOTE — Assessment & Plan Note (Signed)
Recommended eating smaller high protein, low fat meals more frequently and exercising 30 mins a day 5 times a week with a goal of 10-15lb weight loss in the next 3 months. Patient voiced their understanding and motivation to adhere to these recommendations.  

## 2021-08-17 NOTE — Assessment & Plan Note (Signed)
Chronic, stable.  Continue Seroquel 25 MG at night as needed, refills sent.  Educated on medication.  Recommend focus on sleep hygiene techniques and educated on these.  Return in 6 months for follow-up. ?

## 2021-08-17 NOTE — Assessment & Plan Note (Signed)
Chronic, stable.  PHQ9 = 0 and GAD7 = 0.  Will continue Celexa and Buspar to take as needed. She denies SI/HI.  Discussed meditation for anxiety.  Will follow-up on mood in 6 months.  Refills sent. ?

## 2021-08-17 NOTE — Assessment & Plan Note (Signed)
Noted past labs, last 120.  ASCVD 2.9%.  At this time continue diet and regular activity focus, discussed with patient at length.  Consider statin in future if elevations continue. ?

## 2021-08-17 NOTE — Progress Notes (Signed)
BP 118/72 (BP Location: Left Arm, Patient Position: Sitting, Cuff Size: Normal)    Pulse 91    Temp 98.4 F (36.9 C) (Oral)    Ht 5' 4.5" (1.638 m)    Wt 174 lb 9.6 oz (79.2 kg)    SpO2 98%    BMI 29.51 kg/m    Subjective:    Patient ID: Pamela Barrett, female    DOB: [birth year], 66 y.o.   MRN: [id number]  HPI: Pamela Barrett is a 66 y.o. female presenting on 08/17/2021 for comprehensive medical examination. Current medical complaints include:none  She currently lives with: grandson -- is at [facility], major cyber security Menopausal Symptoms: no  The 10-year ASCVD risk score (Arnett DK, et al., 2019) is: 4.4%   Values used to calculate the score:     Age: 65 years     Sex: Female     Is Non-Hispanic African American: No     Diabetic: No     Tobacco smoker: No     Systolic Blood Pressure: 595 mmHg     Is BP treated: No     HDL Cholesterol: 67 mg/dL     Total Cholesterol: 202 mg/dL  OSTEOPENIA Noted on DEXA October 2022 -- taking Vitamin D daily. Past osteoporosis medications/treatments:  Adequate calcium & vitamin D: yes Weight bearing exercises: yes   ANXIETY/STRESS Continues on Celexa 20 MG and Buspar 5 MG as needed (uses minimally) + Seroquel for sleep as needed.  She quit smoking 5 years ago, smoked for 25 years, 1 PPD.  Continues on Vitamin D daily for low levels in past. Duration:stable Anxious mood: no  Excessive worrying: no Irritability: no  Sweating: yes Nausea: no Palpitations:no Hyperventilation: no Panic attacks: no Agoraphobia: no  Obscessions/compulsions: no Depressed mood: no Depression screen [facility] 2/9 08/17/2021 02/16/2021 07/25/2020 04/18/2020 08/31/2019  Decreased Interest 0 0 0 0 0  Down, Depressed, Hopeless 0 0 0 0 0  PHQ - 2 Score 0 0 0 0 0  Altered sleeping 0 0 0 0 3  Tired, decreased energy 0 0 0 0 0  Change in appetite 0 0 0 0 0  Feeling bad or failure about yourself  0 0 0 0 0  Trouble concentrating 0 0 0 0 0  Moving slowly or  fidgety/restless 0 0 0 0 0  Suicidal thoughts 0 0 0 0 0  PHQ-9 Score 0 0 0 0 3  Difficult doing work/chores - Not difficult at all - Not difficult at all Not difficult at all  Anhedonia: no Weight changes: no Insomnia: none Hypersomnia: no Fatigue/loss of energy: no Feelings of worthlessness: no Feelings of guilt: no Impaired concentration/indecisiveness: yes Suicidal ideations: no  Crying spells: no Recent Stressors/Life Changes: no   Relationship problems: no   Family stress: no     Financial stress: no    Job stress: no    Recent death/loss: no GAD 7 : Generalized Anxiety Score 08/17/2021 02/16/2021 07/25/2020 04/18/2020  Nervous, Anxious, on Edge 0 0 0 0  Control/stop worrying 0 0 - 0  Worry too much - different things 0 0 0 0  Trouble relaxing 0 0 0 0  Restless 0 0 0 0  Easily annoyed or irritable 0 0 0 0  Afraid - awful might happen 0 0 0 0  Total GAD 7 Score 0 0 - 0  Anxiety Difficulty - Not difficult at all - Not difficult at all   Functional Status Survey: Is the patient deaf  or have difficulty hearing?: No Does the patient have difficulty seeing, even when wearing glasses/contacts?: No Does the patient have difficulty concentrating, remembering, or making decisions?: No Does the patient have difficulty walking or climbing stairs?: No Does the patient have difficulty dressing or bathing?: No Does the patient have difficulty doing errands alone such as visiting a doctor's office or shopping?: No   Fall Risk 08/31/2019 10/10/2019 07/25/2020 08/17/2021 08/17/2021  Falls in the past year? 0 - 0 0 0  Was there an injury with Fall? 0 - - 0 0  Fall Risk Category Calculator 0 - - 0 0  Fall Risk Category Low - - Low Low  Patient Fall Risk Level Low fall risk Low fall risk - Low fall risk Low fall risk  Patient at Risk for Falls Due to - - - No Fall Risks No Fall Risks  Fall risk Follow up - - Falls evaluation completed Falls evaluation completed Falls evaluation  completed;Education provided      Past Medical History:  Past Medical History:  Diagnosis Date   Abnormal uterine bleeding (AUB) 05/04/2016   Last Assessment & Plan:   Appears to be AUB-P vs AUB-M; likely cervical polyp visualized on speculum exam and EMB performed  TVUS ordered  My suspicion is that this is most likely a benign process. As she does have a 30 pack year history, I counseled the patient extensively on smoking cessation, as this is her biggest risk factor for chronic disease.   Endometrial Biopsy Procedure Note  Urine p   Anxiety    COVID 06/06/2020   Depression    GERD (gastroesophageal reflux disease)     Surgical History:  Past Surgical History:  Procedure Laterality Date   BREAST EXCISIONAL BIOPSY Left 20+ yrs ago   neg   COLONOSCOPY WITH ESOPHAGOGASTRODUODENOSCOPY (EGD)     COLONOSCOPY WITH PROPOFOL N/A 10/10/2019   Procedure: COLONOSCOPY WITH PROPOFOL;  Surgeon: Toledo, Benay Pike, MD;  Location: [facility] ENDOSCOPY;  Service: Gastroenterology;  Laterality: N/A;   ESOPHAGOGASTRIC FUNDOPLICATION  9242   SQUAMOUS CELL CARCINOMA EXCISION Right     Medications:  Current Outpatient Medications on File Prior to Visit  Medication Sig   aspirin EC 81 MG tablet Take 81 mg by mouth daily.   cholecalciferol (VITAMIN D3) 25 MCG (1000 UNIT) tablet Take 1,000 Units by mouth daily.   ondansetron (ZOFRAN ODT) 4 MG disintegrating tablet Take 1-2 tablets (4-8 mg total) by mouth every 8 (eight) hours as needed for nausea or vomiting.   QUEtiapine (SEROQUEL) 25 MG tablet TAKE 1 TABLET(25 MG) BY MOUTH AT BEDTIME   No current facility-administered medications on file prior to visit.    Allergies:  No Known Allergies  Social History:  Social History   Socioeconomic History   Marital status: Widowed    Spouse name: Not on file   Number of children: Not on file   Years of education: Not on file   Highest education level: Not on file  Occupational History   Not on file   Tobacco Use   Smoking status: Former    Types: Cigarettes    Quit date: 04/07/2017    Years since quitting: 4.3   Smokeless tobacco: Never  Vaping Use   Vaping Use: Every day  Substance and Sexual Activity   Alcohol use: Not Currently   Drug use: Never   Sexual activity: Not Currently  Other Topics Concern   Not on file  Social History Narrative   Not on  file   Social Determinants of Health   Financial Resource Strain: Not on file  Food Insecurity: Not on file  Transportation Needs: Not on file  Physical Activity: Not on file  Stress: Not on file  Social Connections: Not on file  Intimate Partner Violence: Not on file   Social History   Tobacco Use  Smoking Status Former   Types: Cigarettes   Quit date: 04/07/2017   Years since quitting: 4.3  Smokeless Tobacco Never   Social History   Substance and Sexual Activity  Alcohol Use Not Currently    Family History:  Family History  Problem Relation Age of Onset   Alzheimer's disease Father    Cancer Sister        colon   Cancer Paternal Grandfather        colon   Skin cancer Brother    Varicose Veins Son    Breast cancer Neg Hx     Past medical history, surgical history, medications, allergies, family history and social history reviewed with patient today and changes made to appropriate areas of the chart.   Review of Systems - negative All other ROS negative except what is listed above and in the HPI.      Objective:    BP 118/72 (BP Location: Left Arm, Patient Position: Sitting, Cuff Size: Normal)    Pulse 91    Temp 98.4 F (36.9 C) (Oral)    Ht 5' 4.5" (1.638 m)    Wt 174 lb 9.6 oz (79.2 kg)    SpO2 98%    BMI 29.51 kg/m   Wt Readings from Last 3 Encounters:  08/17/21 174 lb 9.6 oz (79.2 kg)  02/16/21 169 lb 6.4 oz (76.8 kg)  07/25/20 165 lb 12.8 oz (75.2 kg)    Physical Exam Vitals and nursing note reviewed.  Constitutional:      General: She is awake. She is not in acute distress.     Appearance: She is well-developed and well-groomed. She is not ill-appearing or toxic-appearing.  HENT:     Head: Normocephalic and atraumatic.     Right Ear: Hearing, tympanic membrane, ear canal and external ear normal. No drainage. There is no impacted cerumen.     Left Ear: Hearing, tympanic membrane, ear canal and external ear normal. No drainage. There is no impacted cerumen.     Nose: Nose normal. No rhinorrhea.     Right Sinus: No maxillary sinus tenderness or frontal sinus tenderness.     Left Sinus: No maxillary sinus tenderness or frontal sinus tenderness.     Mouth/Throat:     Lips: Pink.     Mouth: Mucous membranes are moist.     Pharynx: Oropharynx is clear. Uvula midline.  Eyes:     General: Lids are normal.        Right eye: No discharge.        Left eye: No discharge.     Extraocular Movements: Extraocular movements intact.     Conjunctiva/sclera: Conjunctivae normal.     Pupils: Pupils are equal, round, and reactive to light.     Funduscopic exam:    Right eye: No hemorrhage or AV nicking.        Left eye: No hemorrhage or AV nicking.     Visual Fields: Right eye visual fields normal and left eye visual fields normal.  Neck:     Thyroid: No thyromegaly.     Vascular: No carotid bruit.  Cardiovascular:  Rate and Rhythm: Normal rate and regular rhythm.     Heart sounds: Normal heart sounds. No murmur heard.   No gallop.  Pulmonary:     Effort: Pulmonary effort is normal. No accessory muscle usage or respiratory distress.     Breath sounds: Normal breath sounds.  Chest:  Breasts:    Right: Normal.     Left: Normal.  Abdominal:     General: Bowel sounds are normal.     Palpations: Abdomen is soft.  Musculoskeletal:        General: Normal range of motion.     Cervical back: Normal range of motion and neck supple.     Right lower leg: No edema.     Left lower leg: No edema.  Lymphadenopathy:     Head:     Right side of head: No submental, submandibular,  tonsillar, preauricular or posterior auricular adenopathy.     Left side of head: No submental, submandibular, tonsillar, preauricular or posterior auricular adenopathy.     Cervical: No cervical adenopathy.     Upper Body:     Right upper body: No supraclavicular, axillary or pectoral adenopathy.     Left upper body: No supraclavicular, axillary or pectoral adenopathy.  Skin:    General: Skin is warm and dry.     Capillary Refill: Capillary refill takes less than 2 seconds.     Findings: No rash.  Neurological:     Mental Status: She is alert and oriented to person, place, and time.     Cranial Nerves: Cranial nerves 2-12 are intact.     Motor: Motor function is intact.     Coordination: Coordination is intact.     Gait: Gait is intact.     Deep Tendon Reflexes: Reflexes are normal and symmetric.     Reflex Scores:      Brachioradialis reflexes are 2+ on the right side and 2+ on the left side.      Patellar reflexes are 2+ on the right side and 2+ on the left side. Psychiatric:        Attention and Perception: Attention normal.        Mood and Affect: Mood normal.        Speech: Speech normal.        Behavior: Behavior normal. Behavior is cooperative.        Thought Content: Thought content normal.    Assessment & Plan:   Problem List Items Addressed This Visit       Musculoskeletal and Integument   Osteopenia of left forearm    Chronic, stable.  DEXA noted October 2022 = T-score -2.1.  Continue Vitamin D supplement and adequate Calcium intake daily.  Plan for repeat DEXA in 2027.      Relevant Orders   VITAMIN D 25 Hydroxy (Vit-D Deficiency, Fractures)     Other   Anxiety - Primary    Chronic, stable.  PHQ9 = 0 and GAD7 = 0.  Will continue Celexa and Buspar to take as needed. She denies SI/HI.  Discussed meditation for anxiety.  Will follow-up on mood in 6 months.  Refills sent.      Relevant Medications   busPIRone (BUSPAR) 5 MG tablet   citalopram (CELEXA) 20 MG  tablet   Other Relevant Orders   CBC with Differential/Platelet   Comprehensive metabolic panel   TSH   BMI 28.0-28.9,adult    Recommended eating smaller high protein, low fat meals more frequently and exercising 30  mins a day 5 times a week with a goal of 10-15lb weight loss in the next 3 months. Patient voiced their understanding and motivation to adhere to these recommendations.       Elevated low density lipoprotein (LDL) cholesterol level    Noted past labs, last 120.  ASCVD 2.9%.  At this time continue diet and regular activity focus, discussed with patient at length.  Consider statin in future if elevations continue.      Relevant Orders   Comprehensive metabolic panel   Lipid Panel w/o Chol/HDL Ratio   Former smoker    Recommend continued cessation.  Smoked 1 PPD for 25 years and quit 3 years ago, not qualified for CT lung screening due to less than 30 years.      Insomnia    Chronic, stable.  Continue Seroquel 25 MG at night as needed, refills sent.  Educated on medication.  Recommend focus on sleep hygiene techniques and educated on these.  Return in 6 months for follow-up.      Relevant Orders   CBC with Differential/Platelet   Comprehensive metabolic panel   TSH   Vitamin D deficiency    Ongoing.  Continue supplement and recheck Vit D level today.       Relevant Orders   VITAMIN D 25 Hydroxy (Vit-D Deficiency, Fractures)   Other Visit Diagnoses     Need for shingles vaccine       Shingrix vaccinations ordered today.   Relevant Medications   Zoster Vaccine Adjuvanted [facility]) injection   Zoster Vaccine Adjuvanted [facility]) injection (Start on 10/21/2021)   Encounter for lipid screening for cardiovascular disease       Lipid panel today.   Relevant Orders   Comprehensive metabolic panel   Lipid Panel w/o Chol/HDL Ratio   Encounter for annual physical exam       Annual physical with labs today and health maintenance reviewed.        Follow up  plan: Return in about 6 months (around 02/17/2022) for [place].   LABORATORY TESTING:  - Pap smear: not applicable > 26 years of age  IMMUNIZATIONS:   - Tdap: Tetanus vaccination status reviewed: last tetanus booster within 10 years. - Influenza: Up to date - Pneumovax: Not applicable -- due in September - Prevnar: Up To Date - HPV: Not applicable - Zostavax vaccine: ordered today  SCREENING: -Mammogram: Up To Date -- had October 2022 - Colonoscopy: Up to date  - Bone Density: Up To Date -- October 2022 -Hearing Test: Not applicable  -Spirometry: Not applicable   PATIENT COUNSELING:   Advised to take 1 mg of folate supplement per day if capable of pregnancy.   Sexuality: Discussed sexually transmitted diseases, partner selection, use of condoms, avoidance of unintended pregnancy  and contraceptive alternatives.   Advised to avoid cigarette smoking.  I discussed with the patient that most people either abstain from alcohol or drink within safe limits (<=14/week and <=4 drinks/occasion for males, <=7/weeks and <= 3 drinks/occasion for females) and that the risk for alcohol disorders and other health effects rises proportionally with the number of drinks per week and how often a drinker exceeds daily limits.  Discussed cessation/primary prevention of drug use and availability of treatment for abuse.   Diet: Encouraged to adjust caloric intake to maintain  or achieve ideal body weight, to reduce intake of dietary saturated fat and total fat, to limit sodium intake by avoiding high sodium foods and not adding table  salt, and to maintain adequate dietary potassium and calcium preferably from fresh fruits, vegetables, and low-fat dairy products.    Stressed the importance of regular exercise  Injury prevention: Discussed safety belts, safety helmets, smoke detector, smoking near bedding or upholstery.   Dental health: Discussed importance of regular tooth brushing, flossing,  and dental visits.    NEXT PREVENTATIVE PHYSICAL DUE IN 1 YEAR. Return in about 6 months (around 02/17/2022) for [place].

## 2021-08-18 LAB — LIPID PANEL W/O CHOL/HDL RATIO
Cholesterol, Total: 224 mg/dL — ABNORMAL HIGH (ref 100–199)
HDL: 69 mg/dL (ref >39)
LDL Chol Calc (NIH): 139 mg/dL — ABNORMAL HIGH (ref 0–99)
Triglycerides: 94 mg/dL (ref 0–149)
VLDL Cholesterol Cal: 16 mg/dL (ref 5–40)

## 2021-08-18 LAB — CBC WITH DIFFERENTIAL/PLATELET
Basophils Absolute: 0.1 10*3/uL (ref 0.0–0.2)
Basos: 1 %
EOS (ABSOLUTE): 0.1 10*3/uL (ref 0.0–0.4)
Eos: 1 %
Hematocrit: 45.7 % (ref 34.0–46.6)
Hemoglobin: 14.8 g/dL (ref 11.1–15.9)
Immature Grans (Abs): 0 10*3/uL (ref 0.0–0.1)
Immature Granulocytes: 0 %
Lymphocytes Absolute: 1.1 10*3/uL (ref 0.7–3.1)
Lymphs: 20 %
MCH: 28 pg (ref 26.6–33.0)
MCHC: 32.4 g/dL (ref 31.5–35.7)
MCV: 87 fL (ref 79–97)
Monocytes Absolute: 0.3 10*3/uL (ref 0.1–0.9)
Monocytes: 6 %
Neutrophils Absolute: 3.8 10*3/uL (ref 1.4–7.0)
Neutrophils: 72 %
Platelets: 233 10*3/uL (ref 150–450)
RBC: 5.28 x10E6/uL (ref 3.77–5.28)
RDW: 12.2 % (ref 11.7–15.4)
WBC: 5.3 10*3/uL (ref 3.4–10.8)

## 2021-08-18 LAB — COMPREHENSIVE METABOLIC PANEL
ALT: 13 IU/L (ref 0–32)
AST: 19 IU/L (ref 0–40)
Albumin/Globulin Ratio: 1.8 (ref 1.2–2.2)
Albumin: 4.4 g/dL (ref 3.8–4.8)
Alkaline Phosphatase: 89 IU/L (ref 44–121)
BUN/Creatinine Ratio: 12 (ref 12–28)
BUN: 10 mg/dL (ref 8–27)
Bilirubin Total: 0.3 mg/dL (ref 0.0–1.2)
CO2: 20 mmol/L (ref 20–29)
Calcium: 9.4 mg/dL (ref 8.7–10.3)
Chloride: 103 mmol/L (ref 96–106)
Creatinine, Ser: 0.86 mg/dL (ref 0.57–1.00)
Globulin, Total: 2.5 g/dL (ref 1.5–4.5)
Glucose: 95 mg/dL (ref 70–99)
Potassium: 4.6 mmol/L (ref 3.5–5.2)
Sodium: 140 mmol/L (ref 134–144)
Total Protein: 6.9 g/dL (ref 6.0–8.5)
eGFR: 75 mL/min/{1.73_m2} (ref >59)

## 2021-08-18 LAB — TSH: TSH: 1.28 u[IU]/mL (ref 0.450–4.500)

## 2021-08-18 LAB — VITAMIN D 25 HYDROXY (VIT D DEFICIENCY, FRACTURES): Vit D, 25-Hydroxy: 35.9 ng/mL (ref 30.0–100.0)

## 2021-08-18 NOTE — Progress Notes (Signed)
Contacted via [place] ?The 10-year ASCVD risk score (Arnett DK, et al., 2019) is: 4.6% ?  Values used to calculate the score: ?    Age: 66 years ?    Sex: Female ?    Is Non-Hispanic African American: No ?    Diabetic: No ?    Tobacco smoker: No ?    Systolic Blood Pressure: 237 mmHg ?    Is BP treated: No ?    HDL Cholesterol: 69 mg/dL ?    Total Cholesterol: 224 mg/dL ? ? ?Good evening Pamela Barrett, your labs have returned and overall look great with exception of cholesterol.  Your cholesterol is still high, but continued recommendations to make lifestyle changes. Your LDL is above normal. ?The LDL is the bad cholesterol. ?Over time and in combination with inflammation and other factors, this contributes to plaque which in turn may lead to stroke and/or heart attack down the road. ?Sometimes high LDL is primarily genetic, and people might be eating all the right foods but still have high numbers. ?Other times, there is room for improvement in one's diet and eating healthier can bring this number down and potentially reduce one's risk of heart attack and/or stroke.  ?? ?To reduce your LDL, Remember - more fruits and vegetables, more fish, and limit red meat and dairy products. ?More soy, nuts, beans, barley, lentils, oats and plant sterol ester enriched margarine instead of butter. ?I also encourage eliminating sugar and processed food. ?Remember, shop on the outside of the grocery store and visit your [employer]. ??If you would like to talk with me about dietary changes plus or minus medications for your cholesterol, please let me know. We should recheck your cholesterol in 6-12 months. Any questions? ?Keep being amazing!!  Thank you for allowing me to participate in your care.  I appreciate you. ?Kindest regards, ?Olis Viverette ? ?? ?

## 2021-09-04 ENCOUNTER — Telehealth: Payer: Self-pay | Admitting: Nurse Practitioner

## 2021-09-04 NOTE — Telephone Encounter (Signed)
N/A unable to leave a message for patient to call back and schedule Medicare Annual Wellness Visit (AWV) to be done virtually or by telephone. ? ?No hx of AWV eligible as of 09/05/21 ? ?Please schedule at anytime with [facility] Health Advisor.     ? ?30 Minute appointment  ? ?Any questions, please call me at [phone]  ?

## 2021-09-18 DIAGNOSIS — D225 Melanocytic nevi of trunk: Secondary | ICD-10-CM | POA: Diagnosis not present

## 2021-09-18 DIAGNOSIS — L578 Other skin changes due to chronic exposure to nonionizing radiation: Secondary | ICD-10-CM | POA: Diagnosis not present

## 2021-09-18 DIAGNOSIS — L570 Actinic keratosis: Secondary | ICD-10-CM | POA: Diagnosis not present

## 2021-09-18 DIAGNOSIS — Q828 Other specified congenital malformations of skin: Secondary | ICD-10-CM | POA: Diagnosis not present

## 2021-09-18 DIAGNOSIS — Z859 Personal history of malignant neoplasm, unspecified: Secondary | ICD-10-CM | POA: Diagnosis not present

## 2021-12-07 ENCOUNTER — Ambulatory Visit (INDEPENDENT_AMBULATORY_CARE_PROVIDER_SITE_OTHER): Payer: Medicare Other | Admitting: *Deleted

## 2021-12-07 DIAGNOSIS — Z0000 Encounter for general adult medical examination without abnormal findings: Secondary | ICD-10-CM | POA: Diagnosis not present

## 2021-12-07 NOTE — Progress Notes (Signed)
Subjective:   Pamela Barrett is a 66 y.o. female who presents for an Initial Medicare Annual Wellness Visit.  I connected with  Kiriana J Thaxton on 12/07/21 by a telephone enabled telemedicine application and verified that I am speaking with the correct person using two identifiers.   I discussed the limitations of evaluation and management by telemedicine. The patient expressed understanding and agreed to proceed.  Patient location: home  Provider location: Tele-health-home   Review of Systems     Cardiac Risk Factors include: advanced age (>50mn, >>30women)     Objective:    Today's Vitals   There is no height or weight on file to calculate BMI.     12/07/2021    9:08 AM 10/10/2019   10:22 AM  Advanced Directives  Does Patient Have a Medical Advance Directive? No No  Would patient like information on creating a medical advance directive? No - Patient declined No - Patient declined    Current Medications (verified) Outpatient Encounter Medications as of 12/07/2021  Medication Sig   aspirin EC 81 MG tablet Take 81 mg by mouth daily.   busPIRone (BUSPAR) 5 MG tablet Take 1 tablet (5 mg total) by mouth as needed (for anxiety). Take twice daily as needed by mouth for anxiety.   cholecalciferol (VITAMIN D3) 25 MCG (1000 UNIT) tablet Take 1,000 Units by mouth daily.   citalopram (CELEXA) 20 MG tablet TAKE 1 TABLET(20 MG) BY MOUTH DAILY   ondansetron (ZOFRAN ODT) 4 MG disintegrating tablet Take 1-2 tablets (4-8 mg total) by mouth every 8 (eight) hours as needed for nausea or vomiting.   QUEtiapine (SEROQUEL) 25 MG tablet TAKE 1 TABLET(25 MG) BY MOUTH AT BEDTIME   No facility-administered encounter medications on file as of 12/07/2021.    Allergies (verified) Patient has no known allergies.   History: Past Medical History:  Diagnosis Date   Abnormal uterine bleeding (AUB) 05/04/2016   Last Assessment & Plan:   Appears to be AUB-P vs AUB-M; likely cervical polyp  visualized on speculum exam and EMB performed  TVUS ordered  My suspicion is that this is most likely a benign process. As she does have a 30 pack year history, I counseled the patient extensively on smoking cessation, as this is her biggest risk factor for chronic disease.   Endometrial Biopsy Procedure Note  Urine p   Anxiety    COVID 06/06/2020   Depression    GERD (gastroesophageal reflux disease)    Past Surgical History:  Procedure Laterality Date   BREAST EXCISIONAL BIOPSY Left 20+ yrs ago   neg   COLONOSCOPY WITH ESOPHAGOGASTRODUODENOSCOPY (EGD)     COLONOSCOPY WITH PROPOFOL N/A 10/10/2019   Procedure: COLONOSCOPY WITH PROPOFOL;  Surgeon: Toledo, TBenay Pike MD;  Location: [facility] ENDOSCOPY;  Service: Gastroenterology;  Laterality: N/A;   ESOPHAGOGASTRIC FUNDOPLICATION  25102  SQUAMOUS CELL CARCINOMA EXCISION Right    Family History  Problem Relation Age of Onset   Alzheimer's disease Father    Cancer Sister        colon   Cancer Paternal Grandfather        colon   Skin cancer Brother    Varicose Veins Son    Breast cancer Neg Hx    Social History   Socioeconomic History   Marital status: Widowed    Spouse name: Not on file   Number of children: Not on file   Years of education: Not on file   Highest education level:  Not on file  Occupational History   Not on file  Tobacco Use   Smoking status: Former    Types: Cigarettes    Quit date: 04/07/2017    Years since quitting: 4.6   Smokeless tobacco: Never  Vaping Use   Vaping Use: Every day  Substance and Sexual Activity   Alcohol use: Not Currently   Drug use: Never   Sexual activity: Not Currently  Other Topics Concern   Not on file  Social History Narrative   Not on file   Social Determinants of Health   Financial Resource Strain: Low Risk  (12/07/2021)   Overall Financial Resource Strain (CARDIA)    Difficulty of Paying Living Expenses: Not hard at all  Food Insecurity: No Food Insecurity (12/07/2021)    Hunger Vital Sign    Worried About Running Out of Food in the Last Year: Never true    [place] in the Last Year: Never true  Transportation Needs: No Transportation Needs (12/07/2021)   PRAPARE - Hydrologist (Medical): No    Lack of Transportation (Non-Medical): No  Physical Activity: Sufficiently Active (12/07/2021)   Exercise Vital Sign    Days of Exercise per Week: 5 days    Minutes of Exercise per Session: 40 min  Stress: No Stress Concern Present (12/07/2021)   [place]    Feeling of Stress : Not at all  Social Connections: Moderately Isolated (12/07/2021)   Social Connection and Isolation Panel [NHANES]    Frequency of Communication with Friends and Family: More than three times a week    Frequency of Social Gatherings with Friends and Family: Twice a week    Attends Religious Services: More than 4 times per year    Active Member of [employer] or Organizations: No    Attends Archivist Meetings: Never    Marital Status: Widowed    Tobacco Counseling Counseling given: Not Answered   Clinical Intake:  Pre-visit preparation completed: Yes  Pain : No/denies pain     Nutritional Risks: None Diabetes: No  How often do you need to have someone help you when you read instructions, pamphlets, or other written materials from your doctor or pharmacy?: 1 - Never  Diabetic?  no  Interpreter Needed?: No  Information entered by :: Leroy Kennedy LPN   Activities of Daily Living    12/07/2021    9:09 AM 08/17/2021    8:54 AM  In your present state of health, do you have any difficulty performing the following activities:  Hearing? 0 0  Vision? 0 0  Difficulty concentrating or making decisions? 0 0  Walking or climbing stairs? 0 0  Dressing or bathing? 0 0  Doing errands, shopping? 0 0  Preparing Food and eating ? N   Using the Toilet? N   In the past six months, have you  accidently leaked urine? N   Do you have problems with loss of bowel control? N   Managing your Medications? N   Managing your Finances? N   Housekeeping or managing your Housekeeping? N     Patient Care Team: Venita Lick, NP as PCP - General (Nurse Practitioner) Rico Junker, RN as Registered Nurse Theodore Demark, RN (Inactive) as Registered Nurse  Indicate any recent Medical Services you may have received from other than [facility] providers in the past year (date may be approximate).  Assessment:   This is a routine wellness examination for Ramonita.  Hearing/Vision screen Hearing Screening - Comments:: No trouble hearing Vision Screening - Comments:: Up to date [facility]  Dietary issues and exercise activities discussed: Current Exercise Habits: Home exercise routine;The patient has a physically strenuous job, but has no regular exercise apart from work., Type of exercise: walking, Time (Minutes): 45, Frequency (Times/Week): 5, Weekly Exercise (Minutes/Week): 225, Intensity: Moderate   Goals Addressed             This Visit's Progress    Patient Stated       Continue current lifestyle        Depression Screen    12/07/2021    9:10 AM 08/17/2021    8:43 AM 02/16/2021   11:16 AM 07/25/2020    2:01 PM 04/18/2020    2:44 PM 08/31/2019    3:04 PM 07/20/2019    2:11 PM  PHQ 2/9 Scores  PHQ - 2 Score 0 0 0 0 0 0 0  PHQ- 9 Score  0 0 0 0 3     Fall Risk    12/07/2021    9:14 AM 08/17/2021    8:54 AM 08/17/2021    8:43 AM 07/25/2020    2:00 PM 08/31/2019    3:04 PM  [place] in the past year? 0 0 0 0 0  Number falls in past yr: 0 0 0  0  Injury with Fall? 0 0 0  0  Risk for fall due to :  No Fall Risks No Fall Risks    Follow up Falls evaluation completed;Education provided Falls evaluation completed;Education provided Falls evaluation completed Falls evaluation completed     [place]:  Any stairs in or around the  home? No  If so, are there any without handrails? No  Home free of loose throw rugs in walkways, pet beds, electrical cords, etc? Yes  Adequate lighting in your home to reduce risk of falls? Yes   ASSISTIVE DEVICES UTILIZED TO PREVENT FALLS:  Life alert? No  Use of a cane, walker or w/c? No  Grab bars in the bathroom? No  Shower chair or bench in shower? No  Elevated toilet seat or a handicapped toilet? No   TIMED UP AND GO:  Was the test performed? No .    Cognitive Function:        12/07/2021    9:06 AM  6CIT Screen  What Year? 0 points  What month? 0 points  What time? 0 points  Count back from 20 0 points  Months in reverse 0 points  Repeat phrase 0 points  Total Score 0 points    Immunizations Immunization History  Administered Date(s) Administered   Fluad Quad(high Dose 65+) 02/16/2021   Influenza,inj,Quad PF,6+ Mos 04/25/2019, 04/18/2020   Influenza-Unspecified 04/25/2019   PFIZER(Purple Top)SARS-COV-2 Vaccination 07/19/2019, 08/16/2019   Pneumococcal Conjugate-13 02/16/2021   Tdap 04/25/2019    TDAP status: Up to date  Flu Vaccine status: Up to date  Pneumococcal vaccine status: Due, Education has been provided regarding the importance of this vaccine. Advised may receive this vaccine at local pharmacy or Health Dept. Aware to provide a copy of the vaccination record if obtained from local pharmacy or Health Dept. Verbalized acceptance and understanding.  Covid-19 vaccine status: Declined, Education has been provided regarding the importance of this vaccine but patient still declined. Advised may receive this vaccine at local pharmacy or Health  Dept.or vaccine clinic. Aware to provide a copy of the vaccination record if obtained from local pharmacy or Health Dept. Verbalized acceptance and understanding.  Qualifies for Shingles Vaccine? Yes   Zostavax completed No   Shingrix Completed?: No.    Education has been provided regarding the importance of this  vaccine. Patient has been advised to call insurance company to determine out of pocket expense if they have not yet received this vaccine. Advised may also receive vaccine at local pharmacy or Health Dept. Verbalized acceptance and understanding.  Screening Tests Health Maintenance  Topic Date Due   Pneumonia Vaccine 73+ Years old (2 - PPSV23 if available, else PCV20) 02/16/2022   COVID-19 Vaccine (3 - Pfizer series) 12/23/2021 (Originally 10/11/2019)   Zoster Vaccines- Shingrix (1 of 2) 03/09/2022 (Originally 09/12/2005)   INFLUENZA VACCINE  01/05/2022   COLONOSCOPY (Pts 45-21yr Insurance coverage will need to be confirmed)  10/10/2022   MAMMOGRAM  03/17/2023   DEXA SCAN  03/16/2026   TETANUS/TDAP  04/24/2029   Hepatitis C Screening  Completed   HPV VACCINES  Aged Out    Health Maintenance  Health Maintenance Due  Topic Date Due   Pneumonia Vaccine 66 Years old (2 - PPSV23 if available, else PCV20) 02/16/2022    Colorectal cancer screening: Type of screening: Colonoscopy. Completed 2022. Repeat every 5 years  Mammogram status: Completed 2022. Repeat every year  Bone Density status: Completed 2022. Results reflect: Bone density results: OSTEOPENIA. Repeat every 5 years.  Lung Cancer Screening: (Low Dose CT Chest recommended if Age 66-80years, 30 pack-year currently smoking OR have quit w/in 15years.) does not qualify.   Lung Cancer Screening Referral:   Additional Screening:  Hepatitis C Screening: does not qualify; Completed 2022  Vision Screening: Recommended annual ophthalmology exams for early detection of glaucoma and other disorders of the eye. Is the patient up to date with their annual eye exam?  Yes  Who is the provider or what is the name of the office in which the patient attends annual eye exams? [facility] If pt is not established with a provider, would they like to be referred to a provider to establish care? No .   Dental Screening: Recommended annual dental  exams for proper oral hygiene  Community Resource Referral / Chronic Care Management: CRR required this visit?  No   CCM required this visit?  No      Plan:     I have personally reviewed and noted the following in the patient's chart:   Medical and social history Use of alcohol, tobacco or illicit drugs  Current medications and supplements including opioid prescriptions. Patient is not currently taking opioid prescriptions. Functional ability and status Nutritional status Physical activity Advanced directives List of other physicians Hospitalizations, surgeries, and ER visits in previous 12 months Vitals Screenings to include cognitive, depression, and falls Referrals and appointments  In addition, I have reviewed and discussed with patient certain preventive protocols, quality metrics, and best practice recommendations. A written personalized care plan for preventive services as well as general preventive health recommendations were provided to patient.     JLeroy Kennedy LPN   73/12/9022  Nurse Notes:

## 2021-12-07 NOTE — Patient Instructions (Signed)
[name] , Thank you for taking time to come for your Medicare Wellness Visit. I appreciate your ongoing commitment to your health goals. Please review the following plan we discussed and let me know if I can assist you in the future.   Screening recommendations/referrals: Colonoscopy: up to date Mammogram: up to date Bone Density: up to date Recommended yearly ophthalmology/optometry visit for glaucoma screening and checkup Recommended yearly dental visit for hygiene and checkup  Vaccinations: Influenza vaccine: up to date Pneumococcal vaccine: second dose due at next appointment  Tdap vaccine: up to date Shingles vaccine: educaiton provided    Advanced directives: Education provided  Conditions/risks identified:   Next appointment: 02-19-2022 @ 2:00  [place] 66 Years and Older, Female Preventive care refers to lifestyle choices and visits with your health care provider that can promote health and wellness. What does preventive care include? A yearly physical exam. This is also called an annual well check. Dental exams once or twice a year. Routine eye exams. Ask your health care provider how often you should have your eyes checked. Personal lifestyle choices, including: Daily care of your teeth and gums. Regular physical activity. Eating a healthy diet. Avoiding tobacco and drug use. Limiting alcohol use. Practicing safe sex. Taking low-dose aspirin every day. Taking vitamin and mineral supplements as recommended by your health care provider. What happens during an annual well check? The services and screenings done by your health care provider during your annual well check will depend on your age, overall health, lifestyle risk factors, and family history of disease. Counseling  Your health care provider may ask you questions about your: Alcohol use. Tobacco use. Drug use. Emotional well-being. Home and relationship well-being. Sexual  activity. Eating habits. History of falls. Memory and ability to understand (cognition). Work and work Statistician. Reproductive health. Screening  You may have the following tests or measurements: Height, weight, and BMI. Blood pressure. Lipid and cholesterol levels. These may be checked every 5 years, or more frequently if you are over 101 years old. Skin check. Lung cancer screening. You may have this screening every year starting at age 69 if you have a 30-pack-year history of smoking and currently smoke or have quit within the past 15 years. Fecal occult blood test (FOBT) of the stool. You may have this test every year starting at age 39. Flexible sigmoidoscopy or colonoscopy. You may have a sigmoidoscopy every 5 years or a colonoscopy every 10 years starting at age 77. Hepatitis C blood test. Hepatitis B blood test. Sexually transmitted disease (STD) testing. Diabetes screening. This is done by checking your blood sugar (glucose) after you have not eaten for a while (fasting). You may have this done every 1-3 years. Bone density scan. This is done to screen for osteoporosis. You may have this done starting at age 83. Mammogram. This may be done every 1-2 years. Talk to your health care provider about how often you should have regular mammograms. Talk with your health care provider about your test results, treatment options, and if necessary, the need for more tests. Vaccines  Your health care provider may recommend certain vaccines, such as: Influenza vaccine. This is recommended every year. Tetanus, diphtheria, and acellular pertussis (Tdap, Td) vaccine. You may need a Td booster every 10 years. Zoster vaccine. You may need this after age 31. Pneumococcal 13-valent conjugate (PCV13) vaccine. One dose is recommended after age 42. Pneumococcal polysaccharide (PPSV23) vaccine. One dose is recommended after age 24. Talk  to your health care provider about which screenings and vaccines  you need and how often you need them. This information is not intended to replace advice given to you by your health care provider. Make sure you discuss any questions you have with your health care provider. Document Released: 06/20/2015 Document Revised: 02/11/2016 Document Reviewed: 03/25/2015 Elsevier Interactive Patient Education  2017  Prevention in the Home Falls can cause injuries. They can happen to people of all ages. There are many things you can do to make your home safe and to help prevent falls. What can I do on the outside of my home? Regularly fix the edges of walkways and driveways and fix any cracks. Remove anything that might make you trip as you walk through a door, such as a raised step or threshold. Trim any bushes or trees on the path to your home. Use bright outdoor lighting. Clear any walking paths of anything that might make someone trip, such as rocks or tools. Regularly check to see if handrails are loose or broken. Make sure that both sides of any steps have handrails. Any raised decks and porches should have guardrails on the edges. Have any leaves, snow, or ice cleared regularly. Use sand or salt on walking paths during winter. Clean up any spills in your garage right away. This includes oil or grease spills. What can I do in the bathroom? Use night lights. Install grab bars by the toilet and in the tub and shower. Do not use towel bars as grab bars. Use non-skid mats or decals in the tub or shower. If you need to sit down in the shower, use a plastic, non-slip stool. Keep the floor dry. Clean up any water that spills on the floor as soon as it happens. Remove soap buildup in the tub or shower regularly. Attach bath mats securely with double-sided non-slip rug tape. Do not have throw rugs and other things on the floor that can make you trip. What can I do in the bedroom? Use night lights. Make sure that you have a light by your bed that  is easy to reach. Do not use any sheets or blankets that are too big for your bed. They should not hang down onto the floor. Have a firm chair that has side arms. You can use this for support while you get dressed. Do not have throw rugs and other things on the floor that can make you trip. What can I do in the kitchen? Clean up any spills right away. Avoid walking on wet floors. Keep items that you use a lot in easy-to-reach places. If you need to reach something above you, use a strong step stool that has a grab bar. Keep electrical cords out of the way. Do not use floor polish or wax that makes floors slippery. If you must use wax, use non-skid floor wax. Do not have throw rugs and other things on the floor that can make you trip. What can I do with my stairs? Do not leave any items on the stairs. Make sure that there are handrails on both sides of the stairs and use them. Fix handrails that are broken or loose. Make sure that handrails are as long as the stairways. Check any carpeting to make sure that it is firmly attached to the stairs. Fix any carpet that is loose or worn. Avoid having throw rugs at the top or bottom of the stairs. If you do have throw rugs,  attach them to the floor with carpet tape. Make sure that you have a light switch at the top of the stairs and the bottom of the stairs. If you do not have them, ask someone to add them for you. What else can I do to help prevent falls? Wear shoes that: Do not have high heels. Have rubber bottoms. Are comfortable and fit you well. Are closed at the toe. Do not wear sandals. If you use a stepladder: Make sure that it is fully opened. Do not climb a closed stepladder. Make sure that both sides of the stepladder are locked into place. Ask someone to hold it for you, if possible. Clearly mark and make sure that you can see: Any grab bars or handrails. First and last steps. Where the edge of each step is. Use tools that help you  move around (mobility aids) if they are needed. These include: Canes. Walkers. Scooters. Crutches. Turn on the lights when you go into a dark area. Replace any light bulbs as soon as they burn out. Set up your furniture so you have a clear path. Avoid moving your furniture around. If any of your floors are uneven, fix them. If there are any pets around you, be aware of where they are. Review your medicines with your doctor. Some medicines can make you feel dizzy. This can increase your chance of falling. Ask your doctor what other things that you can do to help prevent falls. This information is not intended to replace advice given to you by your health care provider. Make sure you discuss any questions you have with your health care provider. Document Released: 03/20/2009 Document Revised: 10/30/2015 Document Reviewed: 06/28/2014 Elsevier Interactive Patient Education  2017 [employer].

## 2022-02-14 NOTE — Patient Instructions (Signed)

## 2022-02-19 ENCOUNTER — Encounter: Payer: Self-pay | Admitting: Nurse Practitioner

## 2022-02-19 ENCOUNTER — Ambulatory Visit (INDEPENDENT_AMBULATORY_CARE_PROVIDER_SITE_OTHER): Payer: Medicare Other | Admitting: Nurse Practitioner

## 2022-02-19 VITALS — BP 110/73 | HR 67 | Temp 97.9°F | Ht 64.5 in | Wt 170.7 lb

## 2022-02-19 DIAGNOSIS — F5104 Psychophysiologic insomnia: Secondary | ICD-10-CM

## 2022-02-19 DIAGNOSIS — F419 Anxiety disorder, unspecified: Secondary | ICD-10-CM

## 2022-02-19 DIAGNOSIS — Z23 Encounter for immunization: Secondary | ICD-10-CM

## 2022-02-19 NOTE — Progress Notes (Signed)
BP 110/73   Pulse 67   Temp 97.9 F (36.6 C) (Oral)   Ht 5' 4.5" (1.638 m)   Wt 170 lb 11.2 oz (77.4 kg)   SpO2 98%   BMI 28.85 kg/m    Subjective:    Patient ID: Pamela Barrett, female    DOB: 06/10/1955, 66 y.o.   MRN: [id number]  HPI: Pamela Barrett is a 66 y.o. female  Chief Complaint  Patient presents with   Mood   Insomnia    Patient is here for a six month follow up on Insomnia. Patient denies having any concerns at today's visit.    ANXIETY/STRESS Taking Celexa 20 MG and Buspar as needed, does not use this often.  Takes Seroquel at night on occasion for sleep, takes about 3-4 times a week. Duration:stable Anxious mood: no  Excessive worrying: no Irritability: no  Sweating: no Nausea: no Palpitations:no Hyperventilation: no Panic attacks: no Agoraphobia: no  Obscessions/compulsions: no Depressed mood: no    2022-03-15    2:05 PM 12/07/2021    9:10 AM 08/17/2021    8:43 AM 02/16/2021   11:16 AM 07/25/2020    2:01 PM  Depression screen PHQ 2/9  Decreased Interest 0 0 0 0 0  Down, Depressed, Hopeless 0 0 0 0 0  PHQ - 2 Score 0 0 0 0 0  Altered sleeping 0  0 0 0  Tired, decreased energy 0  0 0 0  Change in appetite 0  0 0 0  Feeling bad or failure about yourself  0  0 0 0  Trouble concentrating 0  0 0 0  Moving slowly or fidgety/restless 0  0 0 0  Suicidal thoughts 0  0 0 0  PHQ-9 Score 0  0 0 0  Difficult doing work/chores Not difficult at all   Not difficult at all   Anhedonia: no Weight changes: no Insomnia: yes hard to fall asleep  Hypersomnia: no Fatigue/loss of energy: no Feelings of worthlessness: no Feelings of guilt: no Impaired concentration/indecisiveness: no Suicidal ideations: no  Crying spells: no Recent Stressors/Life Changes: no   Relationship problems: no   Family stress: no     Financial stress: no    Job stress: no    Recent death/loss: no    March 15, 2022    2:06 PM 08/17/2021    8:44 AM 02/16/2021   11:17 AM 07/25/2020     2:01 PM  GAD 7 : Generalized Anxiety Score  Nervous, Anxious, on Edge 0 0 0 0  Control/stop worrying 0 0 0   Worry too much - different things 0 0 0 0  Trouble relaxing 0 0 0 0  Restless 0 0 0 0  Easily annoyed or irritable 0 0 0 0  Afraid - awful might happen 0 0 0 0  Total GAD 7 Score 0 0 0   Anxiety Difficulty Not difficult at all  Not difficult at all     Relevant past medical, surgical, family and social history reviewed and updated as indicated. Interim medical history since our last visit reviewed. Allergies and medications reviewed and updated.  Review of Systems  Constitutional:  Negative for activity change, appetite change, diaphoresis, fatigue and fever.  Respiratory:  Negative for cough, chest tightness and shortness of breath.   Cardiovascular:  Negative for chest pain, palpitations and leg swelling.  Gastrointestinal: Negative.   Neurological: Negative.   Psychiatric/Behavioral:  Negative for decreased concentration, self-injury, sleep disturbance and suicidal ideas. The  patient is not nervous/anxious.     Per HPI unless specifically indicated above     Objective:    BP 110/73   Pulse 67   Temp 97.9 F (36.6 C) (Oral)   Ht 5' 4.5" (1.638 m)   Wt 170 lb 11.2 oz (77.4 kg)   SpO2 98%   BMI 28.85 kg/m   Wt Readings from Last 3 Encounters:  02/19/22 170 lb 11.2 oz (77.4 kg)  08/17/21 174 lb 9.6 oz (79.2 kg)  02/16/21 169 lb 6.4 oz (76.8 kg)    Physical Exam Vitals and nursing note reviewed.  Constitutional:      General: She is awake. She is not in acute distress.    Appearance: She is well-developed. She is obese. She is not ill-appearing.  HENT:     Head: Normocephalic.     Right Ear: Hearing normal.     Left Ear: Hearing normal.  Eyes:     General: Lids are normal.        Right eye: No discharge.        Left eye: No discharge.     Conjunctiva/sclera: Conjunctivae normal.     Pupils: Pupils are equal, round, and reactive to light.  Neck:      Vascular: No carotid bruit.  Cardiovascular:     Rate and Rhythm: Normal rate and regular rhythm.     Heart sounds: Normal heart sounds. No murmur heard.    No gallop.  Pulmonary:     Effort: Pulmonary effort is normal. No accessory muscle usage or respiratory distress.     Breath sounds: Normal breath sounds.  Abdominal:     General: Bowel sounds are normal.     Palpations: Abdomen is soft.  Musculoskeletal:     Cervical back: Normal range of motion and neck supple.     Right lower leg: No edema.     Left lower leg: No edema.  Skin:    General: Skin is warm and dry.  Neurological:     Mental Status: She is alert and oriented to person, place, and time.  Psychiatric:        Attention and Perception: Attention normal.        Mood and Affect: Mood normal.        Speech: Speech normal.        Behavior: Behavior normal. Behavior is cooperative.        Thought Content: Thought content normal.      Assessment & Plan:   Problem List Items Addressed This Visit       Other   Anxiety - Primary    Chronic and remains stable.  PHQ9 = 0 and GAD7 = 0.  Will continue Celexa and Buspar to take as needed. She denies SI/HI.  Discussed meditation for anxiety.  Will follow-up on mood in 6 months.       Insomnia    Chronic, stable.  Continue Seroquel 25 MG at night as needed.  Educated on medication.  Recommend focus on sleep hygiene techniques and educated on these.  Return in 6 months.      Other Visit Diagnoses     Pneumococcal vaccination given       PCV20 in office today.   Relevant Orders   Pneumococcal conjugate vaccine 20-valent (Prevnar 20)        Follow up plan: Return in about 6 months (around 08/20/2022) for Annual physical after 08/19/22.

## 2022-02-19 NOTE — Assessment & Plan Note (Signed)
Chronic and remains stable.  PHQ9 = 0 and GAD7 = 0.  Will continue Celexa and Buspar to take as needed. She denies SI/HI.  Discussed meditation for anxiety.  Will follow-up on mood in 6 months.

## 2022-02-19 NOTE — Assessment & Plan Note (Signed)
Chronic, stable.  Continue Seroquel 25 MG at night as needed.  Educated on medication.  Recommend focus on sleep hygiene techniques and educated on these.  Return in 6 months.

## 2022-05-11 DIAGNOSIS — D225 Melanocytic nevi of trunk: Secondary | ICD-10-CM | POA: Diagnosis not present

## 2022-05-11 DIAGNOSIS — L578 Other skin changes due to chronic exposure to nonionizing radiation: Secondary | ICD-10-CM | POA: Diagnosis not present

## 2022-05-11 DIAGNOSIS — L565 Disseminated superficial actinic porokeratosis (DSAP): Secondary | ICD-10-CM | POA: Diagnosis not present

## 2022-05-11 DIAGNOSIS — Z859 Personal history of malignant neoplasm, unspecified: Secondary | ICD-10-CM | POA: Diagnosis not present

## 2022-06-02 IMAGING — MG MM DIGITAL SCREENING BILAT W/ TOMO AND CAD
8 series · 8 of 24 positions shown · non-contrast
Comparison: Previous exam(s).

CLINICAL DATA: Screening.

EXAM:
DIGITAL SCREENING BILATERAL MAMMOGRAM WITH TOMOSYNTHESIS AND CAD
TECHNIQUE: Bilateral screening digital craniocaudal and mediolateral oblique
mammograms were obtained. Bilateral screening digital breast
tomosynthesis was performed. The images were evaluated with
computer-aided detection.

[L CC synth-2D]
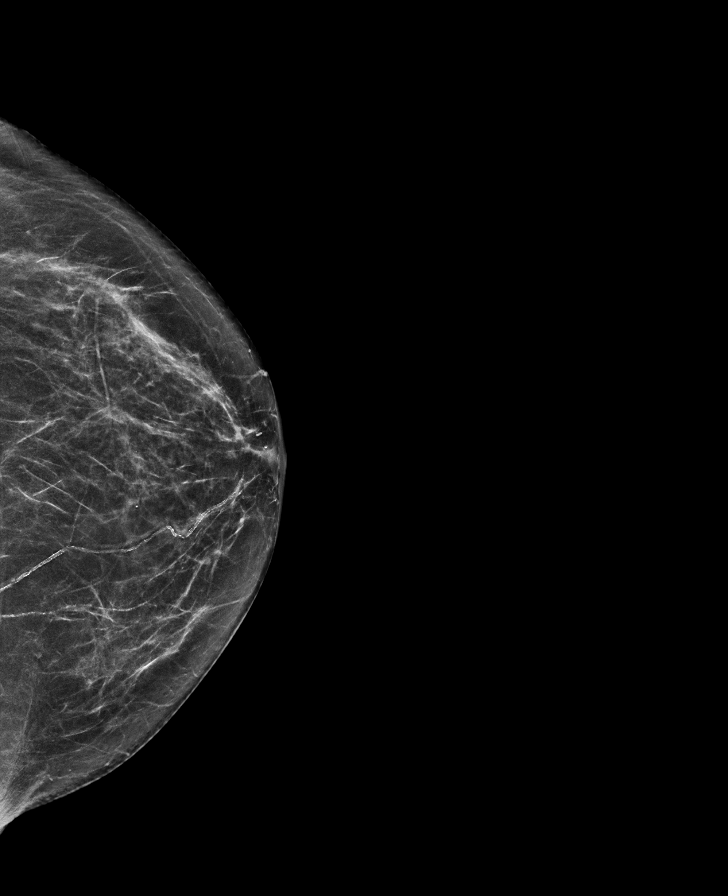

[L MLO synth-2D]
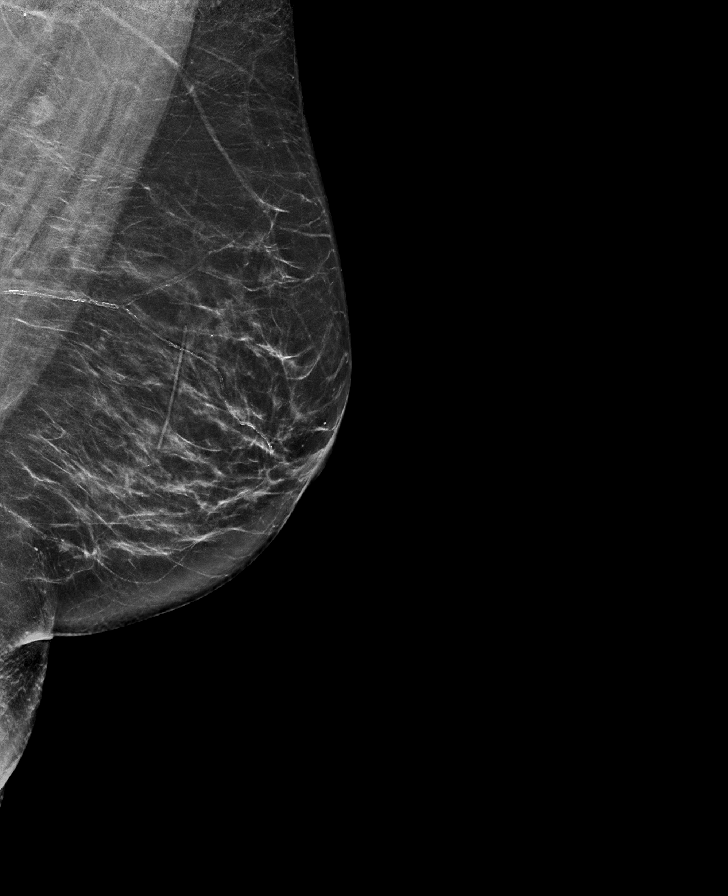

[R MLO synth-2D]
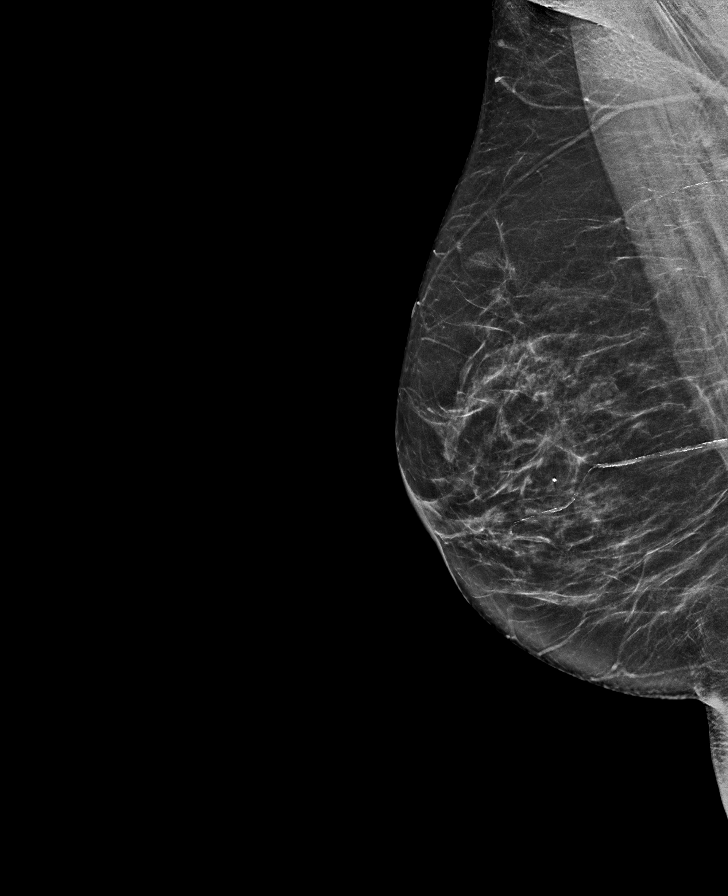

[R CC synth-2D]
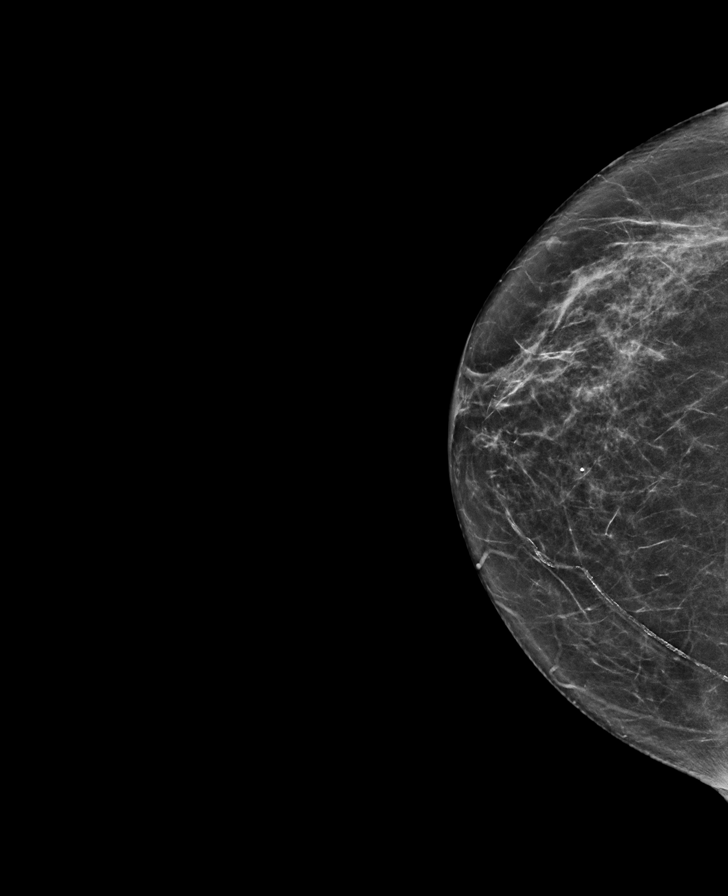

[L CC tomo · tomo slice 35/69.0]
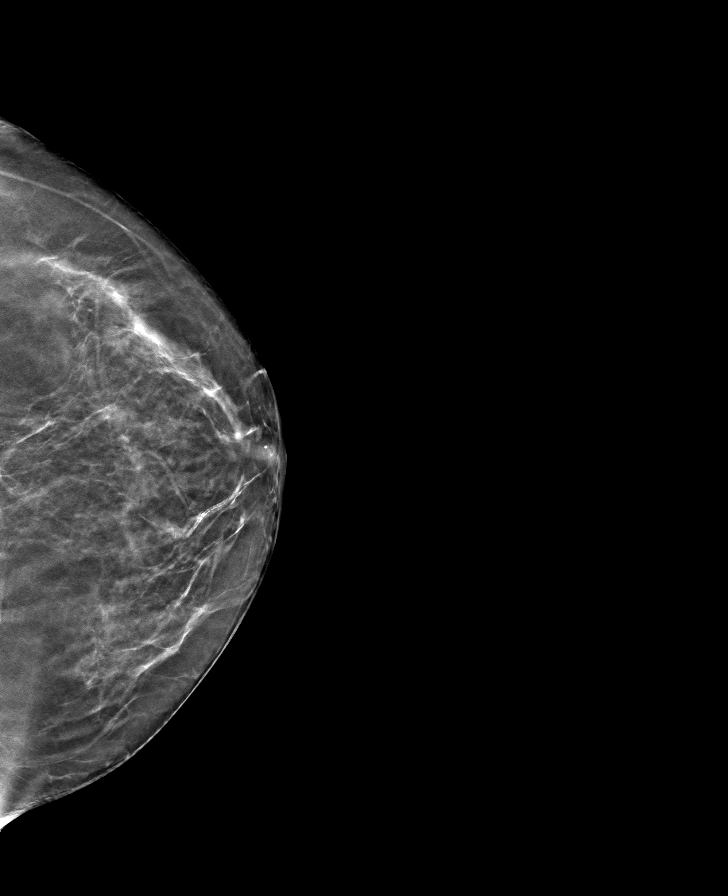

[L MLO tomo · tomo slice 37/74.0]
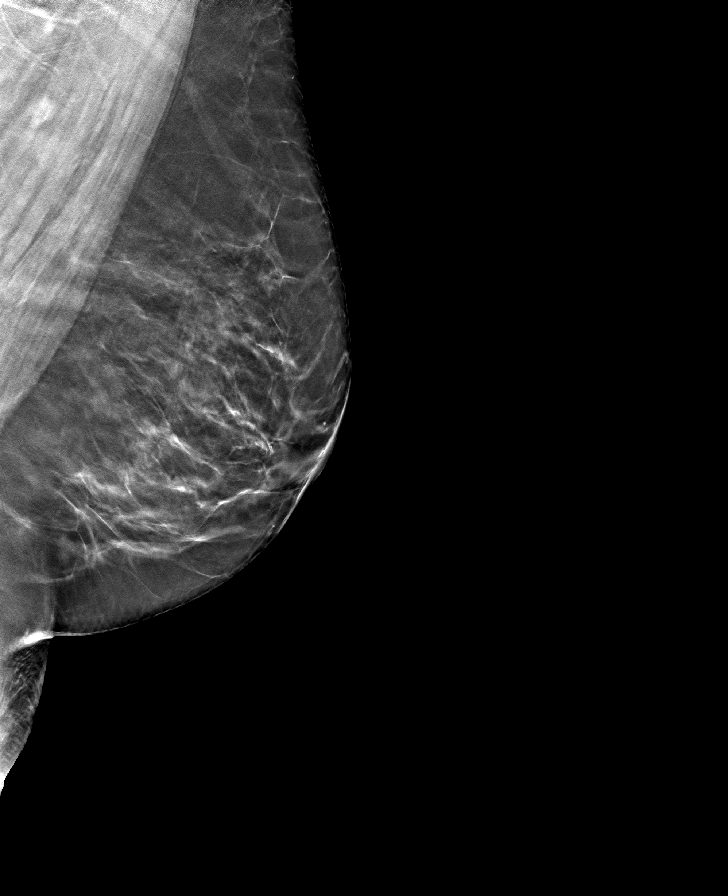

[R MLO tomo · tomo slice 35/69.0]
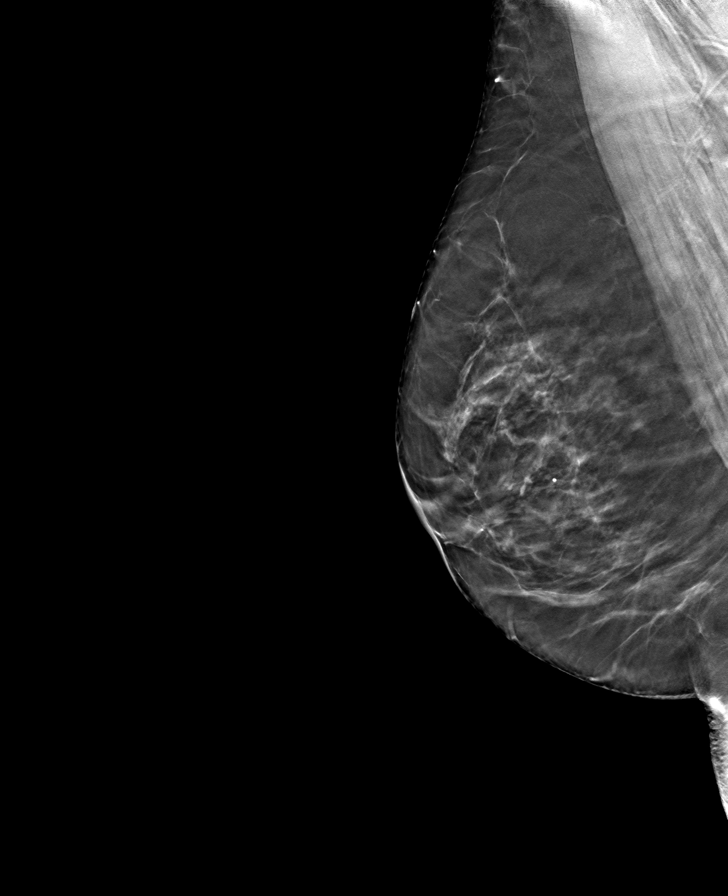

[R CC tomo · tomo slice 34/67.0]
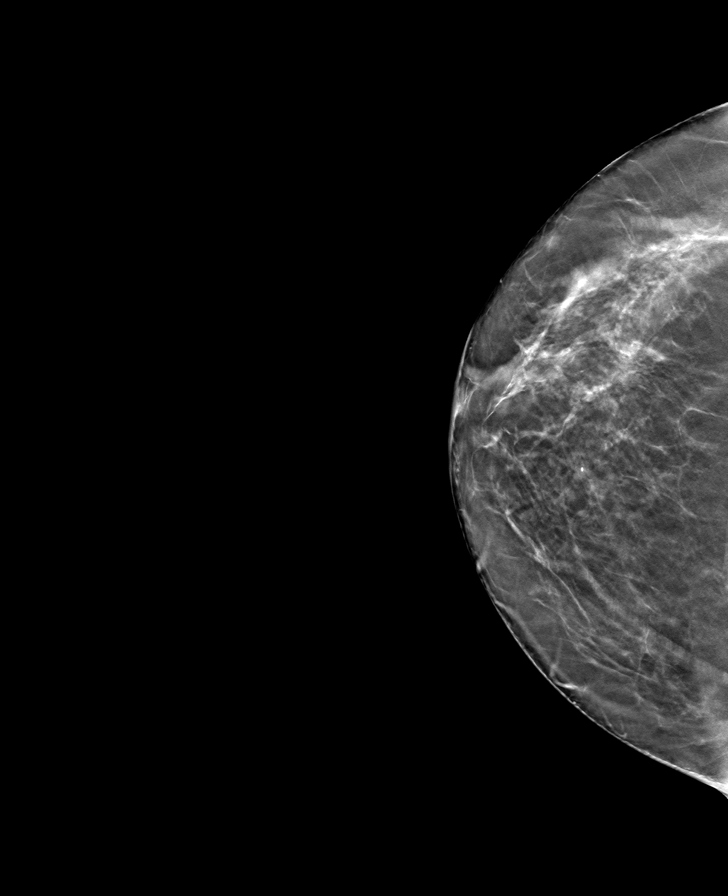

[8 of 24 positions shown; findings below may reference images not displayed]

ACR Breast Density Category b: There are scattered areas of
fibroglandular density.
FINDINGS: There are no findings suspicious for malignancy.
IMPRESSION: No mammographic evidence of malignancy. A result letter of this
screening mammogram will be mailed directly to the patient.

RECOMMENDATION:
Screening mammogram in one year. (Code:51-O-LD2)

BI-RADS CATEGORY  1: Negative.

## 2022-06-10 ENCOUNTER — Other Ambulatory Visit: Payer: Self-pay | Admitting: Nurse Practitioner

## 2022-06-10 NOTE — Telephone Encounter (Signed)
Requested medication (s) are due for refill today - yes  Requested medication (s) are on the active medication list -yes  Future visit scheduled -yes  Last refill: 06/10/21 #90 [id number]  Notes to clinic: non delegated Rx  Requested Prescriptions  Pending Prescriptions Disp Refills   QUEtiapine (SEROQUEL) 25 MG tablet [Pharmacy Med Name: QUETIAPINE 25MG TABLETS] 90 tablet 4    Sig: TAKE 1 TABLET(25 MG) BY MOUTH AT BEDTIME     Not Delegated - Psychiatry:  Antipsychotics - Second Generation (Atypical) - quetiapine Failed - 06/10/2022  8:09 AM      Failed - This refill cannot be delegated      Failed - Lipid Panel in normal range within the last 12 months    Cholesterol, Total  Date Value Ref Range Status  08/17/2021 224 (H) 100 - 199 mg/dL Final   LDL Chol Calc (NIH)  Date Value Ref Range Status  08/17/2021 139 (H) 0 - 99 mg/dL Final   HDL  Date Value Ref Range Status  08/17/2021 69 >39 mg/dL Final   Triglycerides  Date Value Ref Range Status  08/17/2021 94 0 - 149 mg/dL Final         Passed - TSH in normal range and within 360 days    TSH  Date Value Ref Range Status  08/17/2021 1.280 0.450 - 4.500 uIU/mL Final         Passed - Last BP in normal range    BP Readings from Last 1 Encounters:  02/19/22 110/73         Passed - Last Heart Rate in normal range    Pulse Readings from Last 1 Encounters:  02/19/22 67         Passed - Valid encounter within last 6 months    Recent Outpatient Visits           3 months ago [place], Barbaraann Faster, NP   9 months ago [place], [place] T, NP   1 year ago [place], [place] T, NP   1 year ago [place], [place] T, NP   1 year ago COVID-19   [employer], [place], DO       Future Appointments             In 2 months Cannady, [place] T, NP [employer], [facility]             Passed - CBC within normal limits and completed in the last 12 months    WBC  Date Value Ref Range Status  08/17/2021 5.3 3.4 - 10.8 x10E3/uL Final   RBC  Date Value Ref Range Status  08/17/2021 5.28 3.77 - 5.28 x10E6/uL Final   Hemoglobin  Date Value Ref Range Status  08/17/2021 14.8 11.1 - 15.9 g/dL Final   Hematocrit  Date Value Ref Range Status  08/17/2021 45.7 34.0 - 46.6 % Final   MCHC  Date Value Ref Range Status  08/17/2021 32.4 31.5 - 35.7 g/dL Final   [facility]  Date Value Ref Range Status  08/17/2021 28.0 26.6 - 33.0 pg Final   MCV  Date Value Ref Range Status  08/17/2021 87 79 - 97 fL Final   No results found for: "PLTCOUNTKUC", "LABPLAT", "POCPLA" RDW  Date Value Ref Range Status  08/17/2021 12.2 11.7 - 15.4 % Final  Passed - CMP within normal limits and completed in the last 12 months    Albumin  Date Value Ref Range Status  08/17/2021 4.4 3.8 - 4.8 g/dL Final   Alkaline Phosphatase  Date Value Ref Range Status  08/17/2021 89 44 - 121 IU/L Final   ALT  Date Value Ref Range Status  08/17/2021 13 0 - 32 IU/L Final   AST  Date Value Ref Range Status  08/17/2021 19 0 - 40 IU/L Final   BUN  Date Value Ref Range Status  08/17/2021 10 8 - 27 mg/dL Final   Calcium  Date Value Ref Range Status  08/17/2021 9.4 8.7 - 10.3 mg/dL Final   CO2  Date Value Ref Range Status  08/17/2021 20 20 - 29 mmol/L Final   Creatinine, Ser  Date Value Ref Range Status  08/17/2021 0.86 0.57 - 1.00 mg/dL Final   Glucose  Date Value Ref Range Status  08/17/2021 95 70 - 99 mg/dL Final   Potassium  Date Value Ref Range Status  08/17/2021 4.6 3.5 - 5.2 mmol/L Final   Sodium  Date Value Ref Range Status  08/17/2021 140 134 - 144 mmol/L Final   Bilirubin Total  Date Value Ref Range Status  08/17/2021 0.3 0.0 - 1.2 mg/dL Final   Total Protein  Date Value Ref Range Status  08/17/2021 6.9 6.0 - 8.5 g/dL Final   GFR calc Af Amer  Date Value Ref  Range Status  07/25/2020 99 >59 mL/min/1.73 Final    Comment:    **In accordance with recommendations from the NKF-ASN Task force,**   [facility] is in the process of updating its eGFR calculation to the   2021 CKD-EPI creatinine equation that estimates kidney function   without a race variable.    eGFR  Date Value Ref Range Status  08/17/2021 75 >59 mL/min/1.73 Final   GFR calc non Af Amer  Date Value Ref Range Status  07/25/2020 86 >59 mL/min/1.73 Final            Requested Prescriptions  Pending Prescriptions Disp Refills   QUEtiapine (SEROQUEL) 25 MG tablet [Pharmacy Med Name: QUETIAPINE 25MG TABLETS] 90 tablet 4    Sig: TAKE 1 TABLET(25 MG) BY MOUTH AT BEDTIME     Not Delegated - Psychiatry:  Antipsychotics - Second Generation (Atypical) - quetiapine Failed - 06/10/2022  8:09 AM      Failed - This refill cannot be delegated      Failed - Lipid Panel in normal range within the last 12 months    Cholesterol, Total  Date Value Ref Range Status  08/17/2021 224 (H) 100 - 199 mg/dL Final   LDL Chol Calc (NIH)  Date Value Ref Range Status  08/17/2021 139 (H) 0 - 99 mg/dL Final   HDL  Date Value Ref Range Status  08/17/2021 69 >39 mg/dL Final   Triglycerides  Date Value Ref Range Status  08/17/2021 94 0 - 149 mg/dL Final         Passed - TSH in normal range and within 360 days    TSH  Date Value Ref Range Status  08/17/2021 1.280 0.450 - 4.500 uIU/mL Final         Passed - Last BP in normal range    BP Readings from Last 1 Encounters:  02/19/22 110/73         Passed - Last Heart Rate in normal range    Pulse Readings from Last 1 Encounters:  02/19/22 67  Passed - Valid encounter within last 6 months    Recent Outpatient Visits           3 months ago [place], [place] T, NP   9 months ago [place], [place] T, NP   1 year ago [place], [place] T,  NP   1 year ago [place], [place] T, NP   1 year ago COVID-19   [place], [place], DO       Future Appointments             In 2 months Cannady, Barbaraann Faster, NP [employer] [facility], [facility]            Passed - CBC within normal limits and completed in the last 12 months    WBC  Date Value Ref Range Status  08/17/2021 5.3 3.4 - 10.8 x10E3/uL Final   RBC  Date Value Ref Range Status  08/17/2021 5.28 3.77 - 5.28 x10E6/uL Final   Hemoglobin  Date Value Ref Range Status  08/17/2021 14.8 11.1 - 15.9 g/dL Final   Hematocrit  Date Value Ref Range Status  08/17/2021 45.7 34.0 - 46.6 % Final   MCHC  Date Value Ref Range Status  08/17/2021 32.4 31.5 - 35.7 g/dL Final   [facility]  Date Value Ref Range Status  08/17/2021 28.0 26.6 - 33.0 pg Final   MCV  Date Value Ref Range Status  08/17/2021 87 79 - 97 fL Final   No results found for: "PLTCOUNTKUC", "LABPLAT", "POCPLA" RDW  Date Value Ref Range Status  08/17/2021 12.2 11.7 - 15.4 % Final         Passed - CMP within normal limits and completed in the last 12 months    Albumin  Date Value Ref Range Status  08/17/2021 4.4 3.8 - 4.8 g/dL Final   Alkaline Phosphatase  Date Value Ref Range Status  08/17/2021 89 44 - 121 IU/L Final   ALT  Date Value Ref Range Status  08/17/2021 13 0 - 32 IU/L Final   AST  Date Value Ref Range Status  08/17/2021 19 0 - 40 IU/L Final   BUN  Date Value Ref Range Status  08/17/2021 10 8 - 27 mg/dL Final   Calcium  Date Value Ref Range Status  08/17/2021 9.4 8.7 - 10.3 mg/dL Final   CO2  Date Value Ref Range Status  08/17/2021 20 20 - 29 mmol/L Final   Creatinine, Ser  Date Value Ref Range Status  08/17/2021 0.86 0.57 - 1.00 mg/dL Final   Glucose  Date Value Ref Range Status  08/17/2021 95 70 - 99 mg/dL Final   Potassium  Date Value Ref Range Status  08/17/2021 4.6 3.5 - 5.2 mmol/L Final   Sodium  Date Value Ref  Range Status  08/17/2021 140 134 - 144 mmol/L Final   Bilirubin Total  Date Value Ref Range Status  08/17/2021 0.3 0.0 - 1.2 mg/dL Final   Total Protein  Date Value Ref Range Status  08/17/2021 6.9 6.0 - 8.5 g/dL Final   GFR calc Af Amer  Date Value Ref Range Status  07/25/2020 99 >59 mL/min/1.73 Final    Comment:    **In accordance with recommendations from the NKF-ASN Task force,**   [facility] is in the process of updating its eGFR calculation to the   2021 CKD-EPI creatinine equation that  estimates kidney function   without a race variable.    eGFR  Date Value Ref Range Status  08/17/2021 75 >59 mL/min/1.73 Final   GFR calc non Af Amer  Date Value Ref Range Status  07/25/2020 86 >59 mL/min/1.73 Final

## 2022-08-12 ENCOUNTER — Other Ambulatory Visit: Payer: Self-pay | Admitting: Nurse Practitioner

## 2022-08-12 NOTE — Telephone Encounter (Signed)
Requested Prescriptions  Pending Prescriptions Disp Refills   citalopram (CELEXA) 20 MG tablet [Pharmacy Med Name: CITALOPRAM '20MG'$  TABLETS] 90 tablet 0    Sig: TAKE 1 TABLET(20 MG) BY MOUTH DAILY     Psychiatry:  Antidepressants - SSRI Passed - 08/12/2022  8:09 AM      Passed - Valid encounter within last 6 months    Recent Outpatient Visits           5 months ago [place], Henrine Screws T, NP   12 months ago [place], Henrine Screws T, NP   1 year ago [place], Henrine Screws T, NP   2 years ago [place], [place] T, NP   2 years ago [place], [place], DO       Future Appointments             In 1 week Cannady, Barbaraann Faster, NP [place]

## 2022-08-15 NOTE — Patient Instructions (Addendum)
Please call to schedule your mammogram and/or bone density: [facility]  Address: [address], [place], [place] [address] Phone: [phone]  [facility] [place] [address] [place],  [place]  [address] Phone: [phone]    Healthy Eating, Adult Healthy eating may help you get and keep a healthy body weight, reduce the risk of chronic disease, and live a long and productive life. It is important to follow a healthy eating pattern. Your nutritional and calorie needs should be met mainly by different nutrient-rich foods. What are tips for following this plan? Reading food labels Read labels and choose the following: Reduced or low sodium products. Juices with 100% fruit juice. Foods with low saturated fats (<3 g per serving) and high polyunsaturated and monounsaturated fats. Foods with whole grains, such as whole wheat, cracked wheat, brown rice, and wild rice. Whole grains that are fortified with folic acid. This is recommended for females who are pregnant or who want to become pregnant. Read labels and do not eat or drink the following: Foods or drinks with added sugars. These include foods that contain brown sugar, corn sweetener, corn syrup, dextrose, fructose, glucose, high-fructose corn syrup, honey, invert sugar, lactose, malt syrup, maltose, molasses, raw sugar, sucrose, trehalose, or turbinado sugar. Limit your intake of added sugars to less than 10% of your total daily calories. Do not eat more than the following amounts of added sugar per day: 6 teaspoons (25 g) for females. 9 teaspoons (38 g) for males. Foods that contain processed or refined starches and grains. Refined grain products, such as white flour, degermed cornmeal, white bread, and white rice. Shopping Choose nutrient-rich snacks, such as vegetables, whole fruits, and nuts. Avoid high-calorie and high-sugar snacks, such as potato chips,  fruit snacks, and candy. Use oil-based dressings and spreads on foods instead of solid fats such as butter, margarine, sour cream, or cream cheese. Limit pre-made sauces, mixes, and "instant" products such as flavored rice, instant noodles, and ready-made pasta. Try more plant-protein sources, such as tofu, tempeh, black beans, edamame, lentils, nuts, and seeds. Explore eating plans such as the Mediterranean diet or vegetarian diet. Try heart-healthy dips made with beans and healthy fats like hummus and guacamole. Vegetables go great with these. Cooking Use oil to saut or stir-fry foods instead of solid fats such as butter, margarine, or lard. Try baking, boiling, grilling, or broiling instead of frying. Remove the fatty part of meats before cooking. Steam vegetables in water or broth. Meal planning  At meals, imagine dividing your plate into fourths: One-half of your plate is fruits and vegetables. One-fourth of your plate is whole grains. One-fourth of your plate is protein, especially lean meats, poultry, eggs, tofu, beans, or nuts. Include low-fat dairy as part of your daily diet. Lifestyle Choose healthy options in all settings, including home, work, school, restaurants, or stores. Prepare your food safely: Wash your hands after handling raw meats. Where you prepare food, keep surfaces clean by regularly washing with hot, soapy water. Keep raw meats separate from ready-to-eat foods, such as fruits and vegetables. Cook seafood, meat, poultry, and eggs to the recommended temperature. Get a food thermometer. Store foods at safe temperatures. In general: Keep cold foods at 40F (4.4C) or below. Keep hot foods at 140F (60C) or above. Keep your freezer at 0F (-17.8C) or below. Foods are not safe to eat if they have been between the temperatures of 40-140F (4.4-60C) for more   than 2 hours. What foods should I eat? Fruits Aim to eat 1-2 cups of fresh, canned (in natural juice),  or frozen fruits each day. One cup of fruit equals 1 small apple, 1 large banana, 8 large strawberries, 1 cup (237 g) canned fruit,  cup (82 g) dried fruit, or 1 cup (240 mL) 100% juice. Vegetables Aim to eat 2-4 cups of fresh and frozen vegetables each day, including different varieties and colors. One cup of vegetables equals 1 cup (91 g) broccoli or cauliflower florets, 2 medium carrots, 2 cups (150 g) raw, leafy greens, 1 large tomato, 1 large bell pepper, 1 large sweet potato, or 1 medium white potato. Grains Aim to eat 5-10 ounce-equivalents of whole grains each day. Examples of 1 ounce-equivalent of grains include 1 slice of bread, 1 cup (40 g) ready-to-eat cereal, 3 cups (24 g) popcorn, or  cup (93 g) cooked rice. Meats and other proteins Try to eat 5-7 ounce-equivalents of protein each day. Examples of 1 ounce-equivalent of protein include 1 egg,  oz nuts (12 almonds, 24 pistachios, or 7 walnut halves), 1/4 cup (90 g) cooked beans, 6 tablespoons (90 g) hummus or 1 tablespoon (16 g) peanut butter. A cut of meat or fish that is the size of a deck of cards is about 3-4 ounce-equivalents (85 g). Of the protein you eat each week, try to have at least 8 sounce (227 g) of seafood. This is about 2 servings per week. This includes salmon, trout, herring, sardines, and anchovies. Dairy Aim to eat 3 cup-equivalents of fat-free or low-fat dairy each day. Examples of 1 cup-equivalent of dairy include 1 cup (240 mL) milk, 8 ounces (250 g) yogurt, 1 ounces (44 g) natural cheese, or 1 cup (240 mL) fortified soy milk. Fats and oils Aim for about 5 teaspoons (21 g) of fats and oils per day. Choose monounsaturated fats, such as canola and olive oils, mayonnaise made with olive oil or avocado oil, avocados, peanut butter, and most nuts, or polyunsaturated fats, such as sunflower, corn, and soybean oils, walnuts, pine nuts, sesame seeds, sunflower seeds, and flaxseed. Beverages Aim for 6 eight-ounce glasses of  water per day. Limit coffee to 3-5 eight-ounce cups per day. Limit caffeinated beverages that have added calories, such as soda and energy drinks. If you drink alcohol: Limit how much you have to: 0-1 drink a day if you are female. 0-2 drinks a day if you are female. Know how much alcohol is in your drink. In the [place] one drink is one 12 oz bottle of beer (355 mL), one 5 oz glass of wine (148 mL), or one 1 oz glass of hard liquor (44 mL). Seasoning and other foods Try not to add too much salt to your food. Try using herbs and spices instead of salt. Try not to add sugar to food. This information is based on [place] nutrition guidelines. To learn more, visit choosemyplate.gov. Exact amounts may vary. You may need different amounts. This information is not intended to replace advice given to you by your health care provider. Make sure you discuss any questions you have with your health care provider. Document Revised: 02/22/2022 Document Reviewed: 02/22/2022 Elsevier Patient Education  2023 Elsevier Inc.  

## 2022-08-20 ENCOUNTER — Encounter: Payer: Self-pay | Admitting: Nurse Practitioner

## 2022-08-20 ENCOUNTER — Ambulatory Visit (INDEPENDENT_AMBULATORY_CARE_PROVIDER_SITE_OTHER): Payer: Medicare Other | Admitting: Nurse Practitioner

## 2022-08-20 VITALS — BP 121/86 | HR 70 | Temp 97.9°F | Ht 64.49 in | Wt 172.6 lb

## 2022-08-20 DIAGNOSIS — F419 Anxiety disorder, unspecified: Secondary | ICD-10-CM

## 2022-08-20 DIAGNOSIS — Z0000 Encounter for general adult medical examination without abnormal findings: Secondary | ICD-10-CM

## 2022-08-20 DIAGNOSIS — Z1231 Encounter for screening mammogram for malignant neoplasm of breast: Secondary | ICD-10-CM | POA: Diagnosis not present

## 2022-08-20 DIAGNOSIS — E559 Vitamin D deficiency, unspecified: Secondary | ICD-10-CM

## 2022-08-20 DIAGNOSIS — F5104 Psychophysiologic insomnia: Secondary | ICD-10-CM

## 2022-08-20 DIAGNOSIS — E7800 Pure hypercholesterolemia, unspecified: Secondary | ICD-10-CM | POA: Diagnosis not present

## 2022-08-20 DIAGNOSIS — M85832 Other specified disorders of bone density and structure, left forearm: Secondary | ICD-10-CM | POA: Diagnosis not present

## 2022-08-20 DIAGNOSIS — Z87891 Personal history of nicotine dependence: Secondary | ICD-10-CM

## 2022-08-20 DIAGNOSIS — Z6828 Body mass index (BMI) 28.0-28.9, adult: Secondary | ICD-10-CM

## 2022-08-20 MED ORDER — BUSPIRONE HCL 5 MG PO TABS: 5.0000 mg | ORAL_TABLET | ORAL | 4 refills | Status: AC | PRN

## 2022-08-20 MED ORDER — CITALOPRAM HYDROBROMIDE 20 MG PO TABS: ORAL_TABLET | ORAL | 4 refills | Status: DC

## 2022-08-20 MED ORDER — QUETIAPINE FUMARATE 25 MG PO TABS: ORAL_TABLET | ORAL | 4 refills | Status: DC

## 2022-08-20 NOTE — Assessment & Plan Note (Signed)
Chronic, stable.  DEXA noted October 2022 = T-score -2.1.  Continue Vitamin D supplement and adequate Calcium intake daily.  Plan for repeat DEXA in October 2027.

## 2022-08-20 NOTE — Assessment & Plan Note (Signed)
Chronic and remains stable.  PHQ9 = 0 and GAD7 = 0.  Will continue Celexa and Buspar to take as needed. She denies SI/HI.  Discussed meditation for anxiety.  Will follow-up on mood in 6 months.  

## 2022-08-20 NOTE — Progress Notes (Signed)
BP 121/86   Pulse 70   Temp 97.9 F (36.6 C) (Oral)   Ht 5' 4.49" (1.638 m)   Wt 172 lb 9.6 oz (78.3 kg)   SpO2 98%   BMI 29.18 kg/m    Subjective:    Patient ID: Pamela Barrett, female    DOB: 1955/07/29, 67 y.o.   MRN: [id number]  HPI: Pamela Barrett is a 67 y.o. female presenting on August 27, 2022 for comprehensive medical examination. Current medical complaints include:none  She currently lives with: self Menopausal Symptoms: no  The 10-year ASCVD risk score (Arnett DK, et al., 2019) is: 5.4%   Values used to calculate the score:     Age: 89 years     Sex: Female     Is Non-Hispanic African American: No     Diabetic: No     Tobacco smoker: No     Systolic Blood Pressure: 123XX123 mmHg     Is BP treated: No     HDL Cholesterol: 69 mg/dL     Total Cholesterol: 224 mg/dL  OSTEOPENIA Noted on DEXA October 2022 -- taking Vitamin D daily. Past osteoporosis medications/treatments:  Adequate calcium & vitamin D: yes Weight bearing exercises: yes   ANXIETY/STRESS Continues on Celexa 20 MG and Buspar 5 MG as needed (uses minimally) + Seroquel for sleep as needed.  She quit smoking 6 years ago, smoked for 25 years, 1 PPD.  Continues on Vitamin D daily for history of low levels. Duration:stable Anxious mood: no Excessive worrying: no Irritability: no  Sweating: occasional Nausea: no Palpitations:no Hyperventilation: no Panic attacks: no Agoraphobia: no  Obscessions/compulsions: no Depressed mood: no    2022-08-27    9:26 AM 02/19/2022    2:05 PM 12/07/2021    9:10 AM 08/17/2021    8:43 AM 02/16/2021   11:16 AM  Depression screen PHQ 2/9  Decreased Interest 0 0 0 0 0  Down, Depressed, Hopeless 0 0 0 0 0  PHQ - 2 Score 0 0 0 0 0  Altered sleeping 0 0  0 0  Tired, decreased energy 0 0  0 0  Change in appetite 0 0  0 0  Feeling bad or failure about yourself  0 0  0 0  Trouble concentrating 0 0  0 0  Moving slowly or fidgety/restless 0 0  0 0  Suicidal thoughts 0 0   0 0  PHQ-9 Score 0 0  0 0  Difficult doing work/chores Not difficult at all Not difficult at all   Not difficult at all  Anhedonia: no Weight changes: no Insomnia: none Hypersomnia: no Fatigue/loss of energy: no Feelings of worthlessness: no Feelings of guilt: no Impaired concentration/indecisiveness: no Suicidal ideations: no  Crying spells: no Recent Stressors/Life Changes: no   Relationship problems: no   Family stress: no     Financial stress: no    Job stress: no    Recent death/loss: no    27-Aug-2022    9:26 AM 02/19/2022    2:06 PM 08/17/2021    8:44 AM 02/16/2021   11:17 AM  GAD 7 : Generalized Anxiety Score  Nervous, Anxious, on Edge 0 0 0 0  Control/stop worrying 0 0 0 0  Worry too much - different things 0 0 0 0  Trouble relaxing 0 0 0 0  Restless 0 0 0 0  Easily annoyed or irritable 0 0 0 0  Afraid - awful might happen 0 0 0 0  Total GAD 7  Score 0 0 0 0  Anxiety Difficulty Not difficult at all Not difficult at all  Not difficult at all   Functional Status Survey: Is the patient deaf or have difficulty hearing?: No Does the patient have difficulty seeing, even when wearing glasses/contacts?: No Does the patient have difficulty concentrating, remembering, or making decisions?: No Does the patient have difficulty walking or climbing stairs?: No Does the patient have difficulty dressing or bathing?: No Does the patient have difficulty doing errands alone such as visiting a doctor's office or shopping?: No      08/17/2021    8:43 AM 08/17/2021    8:54 AM 12/07/2021    9:14 AM 02/19/2022    2:06 PM 08/20/2022    9:26 AM  [place] in the past year? 0 0 0 0 0  Was there an injury with Fall? 0 0 0 0 0  Fall Risk Category Calculator 0 0 0 0 0  Fall Risk Category (Retired) Low Low Low Low   (RETIRED) Patient Fall Risk Level Low fall risk Low fall risk Low fall risk Low fall risk   Patient at Risk for Falls Due to No Fall Risks No Fall Risks  No Fall Risks No  Fall Risks  Fall risk Follow up Falls evaluation completed Falls evaluation completed;Education provided Falls evaluation completed;Education provided Falls evaluation completed Falls evaluation completed      Past Medical History:  Past Medical History:  Diagnosis Date   Abnormal uterine bleeding (AUB) 05/04/2016   Last Assessment & Plan:   Appears to be AUB-P vs AUB-M; likely cervical polyp visualized on speculum exam and EMB performed  TVUS ordered  My suspicion is that this is most likely a benign process. As she does have a 30 pack year history, I counseled the patient extensively on smoking cessation, as this is her biggest risk factor for chronic disease.   Endometrial Biopsy Procedure Note  Urine p   Anxiety    COVID 06/06/2020   Depression    GERD (gastroesophageal reflux disease)     Surgical History:  Past Surgical History:  Procedure Laterality Date   BREAST EXCISIONAL BIOPSY Left 20+ yrs ago   neg   COLONOSCOPY WITH ESOPHAGOGASTRODUODENOSCOPY (EGD)     COLONOSCOPY WITH PROPOFOL N/A 10/10/2019   Procedure: COLONOSCOPY WITH PROPOFOL;  Surgeon: [place], Benay Pike, MD;  Location: [facility] ENDOSCOPY;  Service: Gastroenterology;  Laterality: N/A;   ESOPHAGOGASTRIC FUNDOPLICATION  [id number]   SQUAMOUS CELL CARCINOMA EXCISION Right     Medications:  Current Outpatient Medications on File Prior to Visit  Medication Sig   aspirin EC 81 MG tablet Take 81 mg by mouth daily.   cholecalciferol (VITAMIN D3) 25 MCG (1000 UNIT) tablet Take 1,000 Units by mouth daily.   ondansetron (ZOFRAN ODT) 4 MG disintegrating tablet Take 1-2 tablets (4-8 mg total) by mouth every 8 (eight) hours as needed for nausea or vomiting.   No current facility-administered medications on file prior to visit.    Allergies:  No Known Allergies  Social History:  Social History   Socioeconomic History   Marital status: Widowed    Spouse name: Not on file   Number of children: Not on file   Years of  education: Not on file   Highest education level: Not on file  Occupational History   Not on file  Tobacco Use   Smoking status: Former    Types: Cigarettes    Quit date: 04/07/2017    Years since  quitting: 5.3   Smokeless tobacco: Never  Vaping Use   Vaping Use: Every day  Substance and Sexual Activity   Alcohol use: Not Currently   Drug use: Never   Sexual activity: Not Currently  Other Topics Concern   Not on file  Social History Narrative   Not on file   Social Determinants of Health   Financial Resource Strain: Low Risk  (12/07/2021)   Overall Financial Resource Strain (CARDIA)    Difficulty of Paying Living Expenses: Not hard at all  Food Insecurity: No Food Insecurity (12/07/2021)   Hunger Vital Sign    Worried About Running Out of Food in the Last Year: Never true    Ran Out of Food in the Last Year: Never true  Transportation Needs: No Transportation Needs (12/07/2021)   PRAPARE - Hydrologist (Medical): No    Lack of Transportation (Non-Medical): No  Physical Activity: Sufficiently Active (12/07/2021)   Exercise Vital Sign    Days of Exercise per Week: 5 days    Minutes of Exercise per Session: 40 min  Stress: No Stress Concern Present (12/07/2021)   [place]    Feeling of Stress : Not at all  Social Connections: Moderately Isolated (12/07/2021)   Social Connection and Isolation Panel [NHANES]    Frequency of Communication with Friends and Family: More than three times a week    Frequency of Social Gatherings with Friends and Family: Twice a week    Attends Religious Services: More than 4 times per year    Active Member of [employer] or Organizations: No    Attends Archivist Meetings: Never    Marital Status: Widowed  Intimate Partner Violence: Not At Risk (12/07/2021)   Humiliation, Afraid, Rape, and Kick questionnaire    Fear of Current or Ex-Partner: No     Emotionally Abused: No    Physically Abused: No    Sexually Abused: No   Social History   Tobacco Use  Smoking Status Former   Types: Cigarettes   Quit date: 04/07/2017   Years since quitting: 5.3  Smokeless Tobacco Never   Social History   Substance and Sexual Activity  Alcohol Use Not Currently    Family History:  Family History  Problem Relation Age of Onset   Alzheimer's disease Father    Cancer Sister        colon   Cancer Paternal Grandfather        colon   Skin cancer Brother    Varicose Veins Son    Breast cancer Neg Hx     Past medical history, surgical history, medications, allergies, family history and social history reviewed with patient today and changes made to appropriate areas of the chart.   Review of Systems - negative All other ROS negative except what is listed above and in the HPI.      Objective:    BP 121/86   Pulse 70   Temp 97.9 F (36.6 C) (Oral)   Ht 5' 4.49" (1.638 m)   Wt 172 lb 9.6 oz (78.3 kg)   SpO2 98%   BMI 29.18 kg/m   Wt Readings from Last 3 Encounters:  08/20/22 172 lb 9.6 oz (78.3 kg)  02/19/22 170 lb 11.2 oz (77.4 kg)  08/17/21 174 lb 9.6 oz (79.2 kg)    Physical Exam Vitals and nursing note reviewed.  Constitutional:      General: She is  awake. She is not in acute distress.    Appearance: She is well-developed and well-groomed. She is not ill-appearing or toxic-appearing.  HENT:     Head: Normocephalic and atraumatic.     Right Ear: Hearing, tympanic membrane, ear canal and external ear normal. No drainage. There is no impacted cerumen.     Left Ear: Hearing, tympanic membrane, ear canal and external ear normal. No drainage. There is no impacted cerumen.     Nose: Nose normal. No rhinorrhea.     Right Sinus: No maxillary sinus tenderness or frontal sinus tenderness.     Left Sinus: No maxillary sinus tenderness or frontal sinus tenderness.     Mouth/Throat:     Lips: Pink.     Mouth: Mucous membranes are moist.      Pharynx: Oropharynx is clear. Uvula midline.  Eyes:     General: Lids are normal.        Right eye: No discharge.        Left eye: No discharge.     Extraocular Movements: Extraocular movements intact.     Conjunctiva/sclera: Conjunctivae normal.     Pupils: Pupils are equal, round, and reactive to light.     Funduscopic exam:    Right eye: No hemorrhage or AV nicking.        Left eye: No hemorrhage or AV nicking.     Visual Fields: Right eye visual fields normal and left eye visual fields normal.  Neck:     Thyroid: No thyromegaly.     Vascular: No carotid bruit.  Cardiovascular:     Rate and Rhythm: Normal rate and regular rhythm.     Heart sounds: Normal heart sounds. No murmur heard.    No gallop.  Pulmonary:     Effort: Pulmonary effort is normal. No accessory muscle usage or respiratory distress.     Breath sounds: Normal breath sounds.  Chest:  Breasts:    Right: Normal.     Left: Normal.  Abdominal:     General: Bowel sounds are normal.     Palpations: Abdomen is soft.  Musculoskeletal:        General: Normal range of motion.     Cervical back: Normal range of motion and neck supple.     Right lower leg: No edema.     Left lower leg: No edema.  Lymphadenopathy:     Head:     Right side of head: No submental, submandibular, tonsillar, preauricular or posterior auricular adenopathy.     Left side of head: No submental, submandibular, tonsillar, preauricular or posterior auricular adenopathy.     Cervical: No cervical adenopathy.     Upper Body:     Right upper body: No supraclavicular, axillary or pectoral adenopathy.     Left upper body: No supraclavicular, axillary or pectoral adenopathy.  Skin:    General: Skin is warm and dry.     Capillary Refill: Capillary refill takes less than 2 seconds.     Findings: No rash.  Neurological:     Mental Status: She is alert and oriented to person, place, and time.     Cranial Nerves: Cranial nerves 2-12 are intact.      Motor: Motor function is intact.     Coordination: Coordination is intact.     Gait: Gait is intact.     Deep Tendon Reflexes: Reflexes are normal and symmetric.     Reflex Scores:      Brachioradialis reflexes are 2+  on the right side and 2+ on the left side.      Patellar reflexes are 2+ on the right side and 2+ on the left side. Psychiatric:        Attention and Perception: Attention normal.        Mood and Affect: Mood normal.        Speech: Speech normal.        Behavior: Behavior normal. Behavior is cooperative.        Thought Content: Thought content normal.     Assessment & Plan:   Problem List Items Addressed This Visit       Musculoskeletal and Integument   Osteopenia of left forearm    Chronic, stable.  DEXA noted October 2022 = T-score -2.1.  Continue Vitamin D supplement and adequate Calcium intake daily.  Plan for repeat DEXA in October 2027.      Relevant Orders   VITAMIN D 25 Hydroxy (Vit-D Deficiency, Fractures)     Other   Anxiety - Primary    Chronic and remains stable.  PHQ9 = 0 and GAD7 = 0.  Will continue Celexa and Buspar to take as needed. She denies SI/HI.  Discussed meditation for anxiety.  Will follow-up on mood in 6 months.       Relevant Medications   citalopram (CELEXA) 20 MG tablet   busPIRone (BUSPAR) 5 MG tablet   Other Relevant Orders   TSH   BMI 28.0-28.9,adult    Recommended eating smaller high protein, low fat meals more frequently and exercising 30 mins a day 5 times a week with a goal of 10-15lb weight loss in the next 3 months. Patient voiced their understanding and motivation to adhere to these recommendations.       Elevated low density lipoprotein (LDL) cholesterol level    Noted past labs, last 130 range.  ASCVD 5.4%.  At this time continue diet and regular activity focus, discussed with patient at length.  Consider statin in future if elevations continue, discussed with her and educated her on findings -- will consider  Rosuvastatin if trends up continue.      Relevant Orders   Comprehensive metabolic panel   Lipid Panel w/o Chol/HDL Ratio   Former smoker    Recommend continued cessation.  Smoked 1 PPD for 25 years and quit 6 years ago, will place referral for lung cancer screening -- she is interested in this.      Relevant Orders   CBC with Differential/Platelet   Ambulatory Referral Lung Cancer Screening  Pulmonary   Insomnia    Chronic, stable.  Continue Seroquel 25 MG at night as needed.  Educated on medication.  Recommend focus on sleep hygiene techniques and educated on these.  Return in 6 months.      Vitamin D deficiency    Ongoing.  Continue supplement and recheck Vit D level today.       Relevant Orders   VITAMIN D 25 Hydroxy (Vit-D Deficiency, Fractures)   Other Visit Diagnoses     Encounter for screening mammogram for malignant neoplasm of breast       Mammogram ordered   Relevant Orders   MM 3D SCREENING MAMMOGRAM BILATERAL BREAST   Encounter for annual physical exam       Annual physical today with labs and health maintenance reviewed, discussed with patient.        Follow up plan: Return in about 6 months (around 02/20/2023) for [place].   LABORATORY TESTING:  -  Pap smear: not applicable > 72 years of age  IMMUNIZATIONS:   - Tdap: Tetanus vaccination status reviewed: last tetanus booster within 10 years. - Influenza: Up to date - Pneumovax: Up To Date - Prevnar: Up To Date - HPV: Not applicable - Zostavax vaccine: will think about this  SCREENING: -Mammogram: Not Up To Date -- had October 2022 - Colonoscopy: Up to date  - Bone Density: Up To Date -- October 2022 -Hearing Test: Not applicable  -Spirometry: Not applicable   PATIENT COUNSELING:   Advised to take 1 mg of folate supplement per day if capable of pregnancy.   Sexuality: Discussed sexually transmitted diseases, partner selection, use of condoms, avoidance of unintended pregnancy  and  contraceptive alternatives.   Advised to avoid cigarette smoking.  I discussed with the patient that most people either abstain from alcohol or drink within safe limits (<=14/week and <=4 drinks/occasion for males, <=7/weeks and <= 3 drinks/occasion for females) and that the risk for alcohol disorders and other health effects rises proportionally with the number of drinks per week and how often a drinker exceeds daily limits.  Discussed cessation/primary prevention of drug use and availability of treatment for abuse.   Diet: Encouraged to adjust caloric intake to maintain  or achieve ideal body weight, to reduce intake of dietary saturated fat and total fat, to limit sodium intake by avoiding high sodium foods and not adding table salt, and to maintain adequate dietary potassium and calcium preferably from fresh fruits, vegetables, and low-fat dairy products.    Stressed the importance of regular exercise  Injury prevention: Discussed safety belts, safety helmets, smoke detector, smoking near bedding or upholstery.   Dental health: Discussed importance of regular tooth brushing, flossing, and dental visits.    NEXT PREVENTATIVE PHYSICAL DUE IN 1 YEAR. Return in about 6 months (around 02/20/2023) for [place].

## 2022-08-20 NOTE — Assessment & Plan Note (Signed)
Chronic, stable.  Continue Seroquel 25 MG at night as needed.  Educated on medication.  Recommend focus on sleep hygiene techniques and educated on these.  Return in 6 months. 

## 2022-08-20 NOTE — Assessment & Plan Note (Addendum)
Noted past labs, last 130 range.  ASCVD 5.4%.  At this time continue diet and regular activity focus, discussed with patient at length.  Consider statin in future if elevations continue, discussed with her and educated her on findings -- will consider Rosuvastatin if trends up continue.

## 2022-08-20 NOTE — Assessment & Plan Note (Signed)
Recommend continued cessation.  Smoked 1 PPD for 25 years and quit 6 years ago, will place referral for lung cancer screening -- she is interested in this.

## 2022-08-20 NOTE — Assessment & Plan Note (Signed)
Ongoing.  Continue supplement and recheck Vit D level today.  ?

## 2022-08-20 NOTE — Assessment & Plan Note (Signed)
Recommended eating smaller high protein, low fat meals more frequently and exercising 30 mins a day 5 times a week with a goal of 10-15lb weight loss in the next 3 months. Patient voiced their understanding and motivation to adhere to these recommendations.  

## 2022-08-21 LAB — LIPID PANEL W/O CHOL/HDL RATIO
Cholesterol, Total: 228 mg/dL — ABNORMAL HIGH (ref 100–199)
HDL: 71 mg/dL (ref >39)
LDL Chol Calc (NIH): 140 mg/dL — ABNORMAL HIGH (ref 0–99)
Triglycerides: 97 mg/dL (ref 0–149)
VLDL Cholesterol Cal: 17 mg/dL (ref 5–40)

## 2022-08-21 LAB — CBC WITH DIFFERENTIAL/PLATELET
Basophils Absolute: 0 10*3/uL (ref 0.0–0.2)
Basos: 1 %
EOS (ABSOLUTE): 0.1 10*3/uL (ref 0.0–0.4)
Eos: 1 %
Hematocrit: 41.9 % (ref 34.0–46.6)
Hemoglobin: 13.9 g/dL (ref 11.1–15.9)
Immature Grans (Abs): 0 10*3/uL (ref 0.0–0.1)
Immature Granulocytes: 0 %
Lymphocytes Absolute: 1.1 10*3/uL (ref 0.7–3.1)
Lymphs: 21 %
MCH: 29 pg (ref 26.6–33.0)
MCHC: 33.2 g/dL (ref 31.5–35.7)
MCV: 88 fL (ref 79–97)
Monocytes Absolute: 0.3 10*3/uL (ref 0.1–0.9)
Monocytes: 6 %
Neutrophils Absolute: 3.5 10*3/uL (ref 1.4–7.0)
Neutrophils: 71 %
Platelets: 202 10*3/uL (ref 150–450)
RBC: 4.79 x10E6/uL (ref 3.77–5.28)
RDW: 12.2 % (ref 11.7–15.4)
WBC: 5 10*3/uL (ref 3.4–10.8)

## 2022-08-21 LAB — COMPREHENSIVE METABOLIC PANEL
ALT: 12 IU/L (ref 0–32)
AST: 16 IU/L (ref 0–40)
Albumin/Globulin Ratio: 2.2 (ref 1.2–2.2)
Albumin: 4.4 g/dL (ref 3.9–4.9)
Alkaline Phosphatase: 82 IU/L (ref 44–121)
BUN/Creatinine Ratio: 16 (ref 12–28)
BUN: 12 mg/dL (ref 8–27)
Bilirubin Total: 0.3 mg/dL (ref 0.0–1.2)
CO2: 22 mmol/L (ref 20–29)
Calcium: 9.1 mg/dL (ref 8.7–10.3)
Chloride: 104 mmol/L (ref 96–106)
Creatinine, Ser: 0.75 mg/dL (ref 0.57–1.00)
Globulin, Total: 2 g/dL (ref 1.5–4.5)
Glucose: 98 mg/dL (ref 70–99)
Potassium: 4.1 mmol/L (ref 3.5–5.2)
Sodium: 140 mmol/L (ref 134–144)
Total Protein: 6.4 g/dL (ref 6.0–8.5)
eGFR: 88 mL/min/{1.73_m2} (ref >59)

## 2022-08-21 LAB — TSH: TSH: 0.756 u[IU]/mL (ref 0.450–4.500)

## 2022-08-21 LAB — VITAMIN D 25 HYDROXY (VIT D DEFICIENCY, FRACTURES): Vit D, 25-Hydroxy: 38.4 ng/mL (ref 30.0–100.0)

## 2022-08-21 NOTE — Progress Notes (Signed)
Contacted via MyChart The 10-year ASCVD risk score (Arnett DK, et al., 2019) is: 5.4%   Values used to calculate the score:     Age: 67 years     Sex: Female     Is Non-Hispanic African American: No     Diabetic: No     Tobacco smoker: No     Systolic Blood Pressure: 123XX123 mmHg     Is BP treated: No     HDL Cholesterol: 71 mg/dL     Total Cholesterol: 228 mg/dL   Good evening Pamela Barrett, your labs have returned: - CBC shows no anemia or infection. - Kidney function, creatinine and eGFR, remains normal, as is liver function, AST and ALT.  - Thyroid and Vitamin D levels stable.  Continue Vitamin D supplement. - Cholesterol labs remain a little elevated and have trended up some, if they continue to trend up in future we may need to discuss statin therapy.  For now focus heavily on diet and exercise + take some Omega 3 gummies or Fish Oil daily.  Any questions? Keep being amazing!!  Thank you for allowing me to participate in your care.  I appreciate you. Kindest regards, Ketih Goodie

## 2022-09-10 ENCOUNTER — Ambulatory Visit
Admission: RE | Admit: 2022-09-10 | Discharge: 2022-09-10 | Disposition: A | Discharge status: Home / Self Care | Payer: Medicare Other | Source: Ambulatory Visit | Attending: Nurse Practitioner | Admitting: Nurse Practitioner

## 2022-09-10 DIAGNOSIS — Z1231 Encounter for screening mammogram for malignant neoplasm of breast: Secondary | ICD-10-CM | POA: Diagnosis not present

## 2022-09-13 NOTE — Progress Notes (Signed)
Contacted via MyChart   Normal mammogram, may repeat in one year:)

## 2022-10-05 NOTE — Patient Instructions (Signed)
Dysphagia  Dysphagia is trouble swallowing. This condition occurs when solids and liquids stick in a person's throat on the way down to the stomach, or when food takes longer to get to the stomach than usual. You may have problems swallowing food, liquids, or both. You may also have pain while trying to swallow. It may take you more time and effort to swallow something. What are the causes? This condition may be caused by: Muscle problems. These may make it difficult for you to move food and liquids through the esophagus, which is the tube that connects your mouth to your stomach. Blockages. You may have ulcers, scar tissue, or inflammation that blocks the normal passage of food and liquids. Causes of these problems include: Acid reflux from your stomach into your esophagus (gastroesophageal reflux). Infections. Radiation treatment for cancer. Medicines taken without enough fluids to wash them down into your stomach. Stroke. This can affect the nerves and make it difficult to swallow. Nerve problems. These prevent signals from being sent to the muscles of your esophagus to squeeze (contract) and move what you swallow down to your stomach. Globus pharyngeus. This is a common problem that involves a feeling like something is stuck in your throat or a sense of trouble with swallowing, even though nothing is wrong with the swallowing passages. Certain conditions, such as cerebral palsy or Parkinson's disease. What are the signs or symptoms? Common symptoms of this condition include: A feeling that solids or liquids are stuck in your throat on the way down to the stomach. Pain while swallowing. Coughing or gagging while trying to swallow. Other symptoms include: Food moving back from your stomach to your mouth (regurgitation). Noises coming from your throat. Chest discomfort when swallowing. A feeling of fullness when swallowing. Drooling, especially when the throat is blocked. Heartburn. How  is this diagnosed? This condition may be diagnosed by: Barium swallow X-ray. In this test, you will swallow a white liquid that sticks to the inside of your esophagus. X-ray images are then taken. Endoscopy. In this test, a flexible telescope is inserted down your throat to look at your esophagus and your stomach. CT scans or an MRI. How is this treated? Treatment for dysphagia depends on the cause of this condition: If the dysphagia is caused by acid reflux or infection, medicines may be used. These may include antibiotics or heartburn medicines. If the dysphagia is caused by problems with the muscles, swallowing therapy may be used to help you strengthen your swallowing muscles. You may have to do specific exercises to strengthen the muscles or stretch them. If the dysphagia is caused by a blockage or mass, procedures to remove the blockage may be done. You may need surgery and a feeding tube. You may need to make diet changes. Ask your health care provider for specific instructions. Follow these instructions at home: Medicines Take over-the-counter and prescription medicines only as told by your health care provider. If you were prescribed an antibiotic medicine, take it as told by your health care provider. Do not stop taking the antibiotic even if you start to feel better. Eating and drinking  Make any diet changes as told by your health care provider. Work with a diet and nutrition specialist (dietitian) to create an eating plan that will help you get the nutrients you need in order to stay healthy. Eat soft foods that are easier to swallow. Cut your food into small pieces and eat slowly. Take small bites. Eat and drink only when you   are sitting upright. Do not drink alcohol or caffeine. If you need help quitting, ask your health care provider. General instructions Check your weight every day to make sure you are not losing weight. Do not use any products that contain nicotine or  tobacco. These products include cigarettes, chewing tobacco, and vaping devices, such as e-cigarettes. If you need help quitting, ask your health care provider. Keep all follow-up visits. This is important. Contact a health care provider if: You lose weight because you cannot swallow. You cough when you drink liquids. You cough up partially digested food. Get help right away if: You cannot swallow your saliva. You have shortness of breath, a fever, or both. Your voice is hoarse and you have trouble swallowing. These symptoms may represent a serious problem that is an emergency. Do not wait to see if the symptoms will go away. Get medical help right away. Call your local emergency services (911 in the [place] Do not drive yourself to the hospital. Summary Dysphagia is trouble swallowing. This condition occurs when solids and liquids stick in a person's throat on the way down to the stomach. You may cough or gag while trying to swallow. Dysphagia has many possible causes. Treatment for dysphagia depends on the cause of the condition. Keep all follow-up visits. This is important. This information is not intended to replace advice given to you by your health care provider. Make sure you discuss any questions you have with your health care provider. Document Revised: 01/12/2020 Document Reviewed: 01/12/2020 Elsevier Patient Education  2023 [employer]  

## 2022-10-07 ENCOUNTER — Encounter: Payer: Self-pay | Admitting: Nurse Practitioner

## 2022-10-07 ENCOUNTER — Ambulatory Visit (INDEPENDENT_AMBULATORY_CARE_PROVIDER_SITE_OTHER): Payer: Medicare Other | Admitting: Nurse Practitioner

## 2022-10-07 VITALS — BP 120/87 | HR 84 | Temp 97.9°F | Ht 64.49 in | Wt 169.8 lb

## 2022-10-07 DIAGNOSIS — K2100 Gastro-esophageal reflux disease with esophagitis, without bleeding: Secondary | ICD-10-CM

## 2022-10-07 DIAGNOSIS — R1319 Other dysphagia: Secondary | ICD-10-CM

## 2022-10-07 MED ORDER — PANTOPRAZOLE SODIUM 40 MG PO TBEC
40.0000 mg | DELAYED_RELEASE_TABLET | Freq: Every day | ORAL | 0 refills | Status: DC
Start: 2022-10-07 — End: 2022-11-09

## 2022-10-07 NOTE — Assessment & Plan Note (Signed)
History of surgery for reflux 21 years ago with current dysphagia of solids, has had to have stretching of esophagus post surgery in past (last 13 years ago).  Will start on Protonix 40 MG daily to help with current symptoms and place urgent referral to GI.  Discussed with patient.

## 2022-10-07 NOTE — Progress Notes (Signed)
BP 120/87   Pulse 84   Temp 97.9 F (36.6 C) (Oral)   Ht 5' 4.49" (1.638 m)   Wt 169 lb 12.8 oz (77 kg)   SpO2 97%   BMI 28.71 kg/m    Subjective:    Patient ID: Pamela Barrett, female    DOB: [birth year], 67 y.o.   MRN: [id number]  HPI: Pamela Barrett is a 67 y.o. female  Chief Complaint  Patient presents with   Choking    Started choking and having a hard time swallowing her food about a month ago, had surgery for Acid reflux many years ago.    DYSPHAGIA Started having issues with swallowing around 08/21/22.    Has history of acid reflux surgery 21 years ago, had to have esophagus stretched since then, last 13 years ago -- reports scar tissue was present.   Duration: weeks Description of symptom: stuck feeling, will have to "chuck it up" Onset: Immediately upon swallowing Location of dysphagia: chest Dysphagia to solids only: yes Dysphagia to solids & liquids: no  Frequency:intermittent  Progressively getting worse: yes Alleviatiating factors: vomiting, TUMS Provoking factors: certain foods Status: worse EGD: yes Weight loss: no Sensation of lump in throat: yes Heartburn: yes Odynophagia: no Nausea: no Vomiting: yes Drooling/nasal regurgitation/food spillage: no Coughing/choking/dysphonia: no Dysarthria: no Hematemesis: no Regurgitation of undigested food/halitosis: no Chest pain: in sternal area when food stuck   Relevant past medical, surgical, family and social history reviewed and updated as indicated. Interim medical history since our last visit reviewed. Allergies and medications reviewed and updated.  Review of Systems  Constitutional:  Negative for activity change, appetite change, diaphoresis, fatigue and fever.  Respiratory:  Negative for cough, chest tightness and shortness of breath.   Cardiovascular:  Negative for chest pain, palpitations and leg swelling.  Gastrointestinal:  Positive for vomiting. Negative for abdominal distention,  abdominal pain, constipation, diarrhea and nausea.  Neurological: Negative.   Psychiatric/Behavioral:  Negative for decreased concentration, self-injury, sleep disturbance and suicidal ideas. The patient is not nervous/anxious.     Per HPI unless specifically indicated above     Objective:    BP 120/87   Pulse 84   Temp 97.9 F (36.6 C) (Oral)   Ht 5' 4.49" (1.638 m)   Wt 169 lb 12.8 oz (77 kg)   SpO2 97%   BMI 28.71 kg/m   Wt Readings from Last 3 Encounters:  10/07/22 169 lb 12.8 oz (77 kg)  08/20/22 172 lb 9.6 oz (78.3 kg)  02/19/22 170 lb 11.2 oz (77.4 kg)    Physical Exam Vitals and nursing note reviewed.  Constitutional:      General: She is awake. She is not in acute distress.    Appearance: She is well-developed. She is obese. She is not ill-appearing.  HENT:     Head: Normocephalic.     Right Ear: Hearing normal.     Left Ear: Hearing normal.  Eyes:     General: Lids are normal.        Right eye: No discharge.        Left eye: No discharge.     Conjunctiva/sclera: Conjunctivae normal.     Pupils: Pupils are equal, round, and reactive to light.  Neck:     Vascular: No carotid bruit.  Cardiovascular:     Rate and Rhythm: Normal rate and regular rhythm.     Heart sounds: Normal heart sounds. No murmur heard.    No gallop.  Pulmonary:  Effort: Pulmonary effort is normal. No accessory muscle usage or respiratory distress.     Breath sounds: Normal breath sounds.  Abdominal:     General: Bowel sounds are normal.     Palpations: Abdomen is soft.  Musculoskeletal:     Cervical back: Normal range of motion and neck supple.     Right lower leg: No edema.     Left lower leg: No edema.  Skin:    General: Skin is warm and dry.  Neurological:     Mental Status: She is alert and oriented to person, place, and time.  Psychiatric:        Attention and Perception: Attention normal.        Mood and Affect: Mood normal.        Speech: Speech normal.         Behavior: Behavior normal. Behavior is cooperative.        Thought Content: Thought content normal.     Results for orders placed or performed in visit on 08/20/22  CBC with Differential/Platelet  Result Value Ref Range   WBC 5.0 3.4 - 10.8 x10E3/uL   RBC 4.79 3.77 - 5.28 x10E6/uL   Hemoglobin 13.9 11.1 - 15.9 g/dL   Hematocrit 69.6 29.5 - 46.6 %   MCV 88 79 - 97 fL   MCH 29.0 26.6 - 33.0 pg   MCHC 33.2 31.5 - 35.7 g/dL   RDW 28.4 13.2 - 44.0 %   Platelets 202 150 - 450 x10E3/uL   Neutrophils 71 Not Estab. %   Lymphs 21 Not Estab. %   Monocytes 6 Not Estab. %   Eos 1 Not Estab. %   Basos 1 Not Estab. %   Neutrophils Absolute 3.5 1.4 - 7.0 x10E3/uL   Lymphocytes Absolute 1.1 0.7 - 3.1 x10E3/uL   Monocytes Absolute 0.3 0.1 - 0.9 x10E3/uL   EOS (ABSOLUTE) 0.1 0.0 - 0.4 x10E3/uL   Basophils Absolute 0.0 0.0 - 0.2 x10E3/uL   Immature Granulocytes 0 Not Estab. %   Immature Grans (Abs) 0.0 0.0 - 0.1 x10E3/uL  Comprehensive metabolic panel  Result Value Ref Range   Glucose 98 70 - 99 mg/dL   BUN 12 8 - 27 mg/dL   Creatinine, Ser 1.02 0.57 - 1.00 mg/dL   eGFR 88 >72 ZD/GUY/4.03   BUN/Creatinine Ratio 16 12 - 28   Sodium 140 134 - 144 mmol/L   Potassium 4.1 3.5 - 5.2 mmol/L   Chloride 104 96 - 106 mmol/L   CO2 22 20 - 29 mmol/L   Calcium 9.1 8.7 - 10.3 mg/dL   Total Protein 6.4 6.0 - 8.5 g/dL   Albumin 4.4 3.9 - 4.9 g/dL   Globulin, Total 2.0 1.5 - 4.5 g/dL   Albumin/Globulin Ratio 2.2 1.2 - 2.2   Bilirubin Total 0.3 0.0 - 1.2 mg/dL   Alkaline Phosphatase 82 44 - 121 IU/L   AST 16 0 - 40 IU/L   ALT 12 0 - 32 IU/L  Lipid Panel w/o Chol/HDL Ratio  Result Value Ref Range   Cholesterol, Total 228 (H) 100 - 199 mg/dL   Triglycerides 97 0 - 149 mg/dL   HDL 71 >47 mg/dL   VLDL Cholesterol Cal 17 5 - 40 mg/dL   LDL Chol Calc (NIH) 425 (H) 0 - 99 mg/dL  TSH  Result Value Ref Range   TSH 0.756 0.450 - 4.500 uIU/mL  VITAMIN D 25 Hydroxy (Vit-D Deficiency, Fractures)  Result  Value Ref Range  Vit D, 25-Hydroxy 38.4 30.0 - 100.0 ng/mL      Assessment & Plan:   Problem List Items Addressed This Visit       Digestive   Esophageal dysphagia    History of surgery for reflux 21 years ago with current dysphagia of solids, has had to have stretching of esophagus post surgery in past (last 13 years ago) due to scar tissue.  Will start Protonix 40 MG daily at this time and place urgent referral to GI since symptoms have been worsening.  Discussed with patient.        Relevant Orders   Ambulatory referral to Gastroenterology   Gastroesophageal reflux disease with esophagitis without hemorrhage - Primary    History of surgery for reflux 21 years ago with current dysphagia of solids, has had to have stretching of esophagus post surgery in past (last 13 years ago).  Will start on Protonix 40 MG daily to help with current symptoms and place urgent referral to GI.  Discussed with patient.      Relevant Orders   Ambulatory referral to Gastroenterology     Follow up plan: Return if symptoms worsen or fail to improve.

## 2022-10-07 NOTE — Assessment & Plan Note (Signed)
History of surgery for reflux 21 years ago with current dysphagia of solids, has had to have stretching of esophagus post surgery in past (last 13 years ago) due to scar tissue.  Will start Protonix 40 MG daily at this time and place urgent referral to GI since symptoms have been worsening.  Discussed with patient.

## 2022-10-18 DIAGNOSIS — R1319 Other dysphagia: Secondary | ICD-10-CM | POA: Diagnosis not present

## 2022-10-18 DIAGNOSIS — Z86010 Personal history of colonic polyps: Secondary | ICD-10-CM | POA: Diagnosis not present

## 2022-10-18 DIAGNOSIS — K529 Noninfective gastroenteritis and colitis, unspecified: Secondary | ICD-10-CM | POA: Diagnosis not present

## 2022-11-09 ENCOUNTER — Other Ambulatory Visit: Payer: Self-pay | Admitting: Nurse Practitioner

## 2022-11-09 DIAGNOSIS — Z872 Personal history of diseases of the skin and subcutaneous tissue: Secondary | ICD-10-CM | POA: Diagnosis not present

## 2022-11-09 DIAGNOSIS — D225 Melanocytic nevi of trunk: Secondary | ICD-10-CM | POA: Diagnosis not present

## 2022-11-09 DIAGNOSIS — L565 Disseminated superficial actinic porokeratosis (DSAP): Secondary | ICD-10-CM | POA: Diagnosis not present

## 2022-11-09 DIAGNOSIS — L578 Other skin changes due to chronic exposure to nonionizing radiation: Secondary | ICD-10-CM | POA: Diagnosis not present

## 2022-11-09 DIAGNOSIS — D485 Neoplasm of uncertain behavior of skin: Secondary | ICD-10-CM | POA: Diagnosis not present

## 2022-11-09 DIAGNOSIS — Z859 Personal history of malignant neoplasm, unspecified: Secondary | ICD-10-CM | POA: Diagnosis not present

## 2022-11-09 NOTE — Telephone Encounter (Signed)
Requested Prescriptions  Pending Prescriptions Disp Refills   pantoprazole (PROTONIX) 40 MG tablet [Pharmacy Med Name: PANTOPRAZOLE 40MG  TABLETS] 90 tablet 0    Sig: TAKE 1 TABLET(40 MG) BY MOUTH DAILY     Gastroenterology: Proton Pump Inhibitors Passed - 11/09/2022 10:10 AM      Passed - Valid encounter within last 12 months    Recent Outpatient Visits           1 month ago Gastroesophageal reflux disease with esophagitis without hemorrhage   [place] [facility] [place], Corrie Dandy T, NP   2 months ago Anxiety   [place] [place], [place] T, NP   8 months ago Anxiety   [facility] [place], Corrie Dandy T, NP   1 year ago Anxiety   [place] [facility] [place], Corrie Dandy T, NP   1 year ago Anxiety   [place] [facility] [place], Dorie Rank, NP       Future Appointments             In 3 months [place], Dorie Rank, NP [place] [facility]

## 2022-12-13 ENCOUNTER — Ambulatory Visit (INDEPENDENT_AMBULATORY_CARE_PROVIDER_SITE_OTHER): Payer: Medicare Other

## 2022-12-13 VITALS — Ht 64.0 in | Wt 169.0 lb

## 2022-12-13 DIAGNOSIS — Z0000 Encounter for general adult medical examination without abnormal findings: Secondary | ICD-10-CM

## 2022-12-13 NOTE — Patient Instructions (Signed)
Pamela Barrett , Thank you for taking time to come for your Medicare Wellness Visit. I appreciate your ongoing commitment to your health goals. Please review the following plan we discussed and let me know if I can assist you in the future.   These are the goals we discussed:  Goals      DIET - EAT MORE FRUITS AND VEGETABLES     Patient Stated     Continue current lifestyle         This is a list of the screening recommended for you and due dates:  Health Maintenance  Topic Date Due   Screening for Lung Cancer  Never done   Zoster (Shingles) Vaccine (1 of 2) Never done   COVID-19 Vaccine (3 - 2023-24 season) 02/05/2022   Flu Shot  01/06/2023   Medicare Annual Wellness Visit  12/13/2023   Mammogram  09/09/2024   Colon Cancer Screening  10/09/2024   DEXA scan (bone density measurement)  03/16/2026   DTaP/Tdap/Td vaccine (2 - Td or Tdap) 04/24/2029   Pneumonia Vaccine  Completed   Hepatitis C Screening  Completed   HPV Vaccine  Aged Out    Advanced directives: no  Conditions/risks identified: none  Next appointment: Follow up in one year for your annual wellness visit 12/19/23 @ 8:45 am by phone   Preventive Care 65 Years and Older, Female Preventive care refers to lifestyle choices and visits with your health care provider that can promote health and wellness. What does preventive care include? A yearly physical exam. This is also called an annual well check. Dental exams once or twice a year. Routine eye exams. Ask your health care provider how often you should have your eyes checked. Personal lifestyle choices, including: Daily care of your teeth and gums. Regular physical activity. Eating a healthy diet. Avoiding tobacco and drug use. Limiting alcohol use. Practicing safe sex. Taking low-dose aspirin every day. Taking vitamin and mineral supplements as recommended by your health care provider. What happens during an annual well check? The services and screenings  done by your health care provider during your annual well check will depend on your age, overall health, lifestyle risk factors, and family history of disease. Counseling  Your health care provider may ask you questions about your: Alcohol use. Tobacco use. Drug use. Emotional well-being. Home and relationship well-being. Sexual activity. Eating habits. History of falls. Memory and ability to understand (cognition). Work and work Astronomer. Reproductive health. Screening  You may have the following tests or measurements: Height, weight, and BMI. Blood pressure. Lipid and cholesterol levels. These may be checked every 5 years, or more frequently if you are over 56 years old. Skin check. Lung cancer screening. You may have this screening every year starting at age 50 if you have a 30-pack-year history of smoking and currently smoke or have quit within the past 15 years. Fecal occult blood test (FOBT) of the stool. You may have this test every year starting at age 35. Flexible sigmoidoscopy or colonoscopy. You may have a sigmoidoscopy every 5 years or a colonoscopy every 10 years starting at age 86. Hepatitis C blood test. Hepatitis B blood test. Sexually transmitted disease (STD) testing. Diabetes screening. This is done by checking your blood sugar (glucose) after you have not eaten for a while (fasting). You may have this done every 1-3 years. Bone density scan. This is done to screen for osteoporosis. You may have this done starting at age 75. Mammogram. This may be  done every 1-2 years. Talk to your health care provider about how often you should have regular mammograms. Talk with your health care provider about your test results, treatment options, and if necessary, the need for more tests. Vaccines  Your health care provider may recommend certain vaccines, such as: Influenza vaccine. This is recommended every year. Tetanus, diphtheria, and acellular pertussis (Tdap, Td)  vaccine. You may need a Td booster every 10 years. Zoster vaccine. You may need this after age 53. Pneumococcal 13-valent conjugate (PCV13) vaccine. One dose is recommended after age 3. Pneumococcal polysaccharide (PPSV23) vaccine. One dose is recommended after age 109. Talk to your health care provider about which screenings and vaccines you need and how often you need them. This information is not intended to replace advice given to you by your health care provider. Make sure you discuss any questions you have with your health care provider. Document Released: 06/20/2015 Document Revised: 02/11/2016 Document Reviewed: 03/25/2015 Elsevier Interactive Patient Education  2017 [employer].  Fall Prevention in the Home Falls can cause injuries. They can happen to people of all ages. There are many things you can do to make your home safe and to help prevent falls. What can I do on the outside of my home? Regularly fix the edges of walkways and driveways and fix any cracks. Remove anything that might make you trip as you walk through a door, such as a raised step or threshold. Trim any bushes or trees on the path to your home. Use bright outdoor lighting. Clear any walking paths of anything that might make someone trip, such as rocks or tools. Regularly check to see if handrails are loose or broken. Make sure that both sides of any steps have handrails. Any raised decks and porches should have guardrails on the edges. Have any leaves, snow, or ice cleared regularly. Use sand or salt on walking paths during winter. Clean up any spills in your garage right away. This includes oil or grease spills. What can I do in the bathroom? Use night lights. Install grab bars by the toilet and in the tub and shower. Do not use towel bars as grab bars. Use non-skid mats or decals in the tub or shower. If you need to sit down in the shower, use a plastic, non-slip stool. Keep the floor dry. Clean up any  water that spills on the floor as soon as it happens. Remove soap buildup in the tub or shower regularly. Attach bath mats securely with double-sided non-slip rug tape. Do not have throw rugs and other things on the floor that can make you trip. What can I do in the bedroom? Use night lights. Make sure that you have a light by your bed that is easy to reach. Do not use any sheets or blankets that are too big for your bed. They should not hang down onto the floor. Have a firm chair that has side arms. You can use this for support while you get dressed. Do not have throw rugs and other things on the floor that can make you trip. What can I do in the kitchen? Clean up any spills right away. Avoid walking on wet floors. Keep items that you use a lot in easy-to-reach places. If you need to reach something above you, use a strong step stool that has a grab bar. Keep electrical cords out of the way. Do not use floor polish or wax that makes floors slippery. If you must use wax,  use non-skid floor wax. Do not have throw rugs and other things on the floor that can make you trip. What can I do with my stairs? Do not leave any items on the stairs. Make sure that there are handrails on both sides of the stairs and use them. Fix handrails that are broken or loose. Make sure that handrails are as long as the stairways. Check any carpeting to make sure that it is firmly attached to the stairs. Fix any carpet that is loose or worn. Avoid having throw rugs at the top or bottom of the stairs. If you do have throw rugs, attach them to the floor with carpet tape. Make sure that you have a light switch at the top of the stairs and the bottom of the stairs. If you do not have them, ask someone to add them for you. What else can I do to help prevent falls? Wear shoes that: Do not have high heels. Have rubber bottoms. Are comfortable and fit you well. Are closed at the toe. Do not wear sandals. If you use a  stepladder: Make sure that it is fully opened. Do not climb a closed stepladder. Make sure that both sides of the stepladder are locked into place. Ask someone to hold it for you, if possible. Clearly mark and make sure that you can see: Any grab bars or handrails. First and last steps. Where the edge of each step is. Use tools that help you move around (mobility aids) if they are needed. These include: Canes. Walkers. Scooters. Crutches. Turn on the lights when you go into a dark area. Replace any light bulbs as soon as they burn out. Set up your furniture so you have a clear path. Avoid moving your furniture around. If any of your floors are uneven, fix them. If there are any pets around you, be aware of where they are. Review your medicines with your doctor. Some medicines can make you feel dizzy. This can increase your chance of falling. Ask your doctor what other things that you can do to help prevent falls. This information is not intended to replace advice given to you by your health care provider. Make sure you discuss any questions you have with your health care provider. Document Released: 03/20/2009 Document Revised: 10/30/2015 Document Reviewed: 06/28/2014 Elsevier Interactive Patient Education  2017 [employer].

## 2022-12-13 NOTE — Progress Notes (Signed)
Subjective:   Pamela Barrett is a 67 y.o. female who presents for Medicare Annual (Subsequent) preventive examination.  Visit Complete: Virtual  I connected with  Pamela Barrett on 12/13/22 by a audio enabled telemedicine application and verified that I am speaking with the correct person using two identifiers.  Patient Location: Home  Provider Location: Office/Clinic  I discussed the limitations of evaluation and management by telemedicine. The patient expressed understanding and agreed to proceed.   Review of Systems     Cardiac Risk Factors include: advanced age (>44men, >77 women)     Objective:    Today's Vitals   12/13/22 0929  Weight: 169 lb (76.7 kg)  Height: 5\' 4"  (1.626 m)   Body mass index is 29.01 kg/m.     12/13/2022    9:23 AM 12/07/2021    9:08 AM 10/10/2019   10:22 AM  Advanced Directives  Does Patient Have a Medical Advance Directive? No No No  Would patient like information on creating a medical advance directive? No - Patient declined No - Patient declined No - Patient declined    Current Medications (verified) Outpatient Encounter Medications as of 12/13/2022  Medication Sig   aspirin EC 81 MG tablet Take 81 mg by mouth daily.   busPIRone (BUSPAR) 5 MG tablet Take 1 tablet (5 mg total) by mouth as needed (for anxiety). Take twice daily as needed by mouth for anxiety.   cholecalciferol (VITAMIN D3) 25 MCG (1000 UNIT) tablet Take 1,000 Units by mouth daily.   citalopram (CELEXA) 20 MG tablet TAKE 1 TABLET(20 MG) BY MOUTH DAILY   ondansetron (ZOFRAN ODT) 4 MG disintegrating tablet Take 1-2 tablets (4-8 mg total) by mouth every 8 (eight) hours as needed for nausea or vomiting.   pantoprazole (PROTONIX) 40 MG tablet TAKE 1 TABLET(40 MG) BY MOUTH DAILY   QUEtiapine (SEROQUEL) 25 MG tablet TAKE 1 TABLET(25 MG) BY MOUTH AT BEDTIME   No facility-administered encounter medications on file as of 12/13/2022.    Allergies (verified) Patient has no known  allergies.   History: Past Medical History:  Diagnosis Date   Abnormal uterine bleeding (AUB) 05/04/2016   Last Assessment & Plan:   Appears to be AUB-P vs AUB-M; likely cervical polyp visualized on speculum exam and EMB performed  TVUS ordered  My suspicion is that this is most likely a benign process. As she does have a 30 pack year history, I counseled the patient extensively on smoking cessation, as this is her biggest risk factor for chronic disease.   Endometrial Biopsy Procedure Note  Urine p   Anxiety    COVID 06/06/2020   Depression    GERD (gastroesophageal reflux disease)    Past Surgical History:  Procedure Laterality Date   BREAST EXCISIONAL BIOPSY Left 20+ yrs ago   neg   COLONOSCOPY WITH ESOPHAGOGASTRODUODENOSCOPY (EGD)     COLONOSCOPY WITH PROPOFOL N/A 10/10/2019   Procedure: COLONOSCOPY WITH PROPOFOL;  Surgeon: Toledo, Boykin Nearing, MD;  Location: [facility] ENDOSCOPY;  Service: Gastroenterology;  Laterality: N/A;   ESOPHAGOGASTRIC FUNDOPLICATION  2000   SQUAMOUS CELL CARCINOMA EXCISION Right    Family History  Problem Relation Age of Onset   Alzheimer's disease Father    Cancer Sister        colon   Cancer Paternal Grandfather        colon   Skin cancer Brother    Varicose Veins Son    Breast cancer Neg Hx    Social History  Socioeconomic History   Marital status: Widowed    Spouse name: Not on file   Number of children: Not on file   Years of education: Not on file   Highest education level: Not on file  Occupational History   Not on file  Tobacco Use   Smoking status: Former    Types: Cigarettes    Quit date: 04/07/2017    Years since quitting: 5.6   Smokeless tobacco: Never  Vaping Use   Vaping Use: Every day  Substance and Sexual Activity   Alcohol use: Not Currently   Drug use: Never   Sexual activity: Not Currently  Other Topics Concern   Not on file  Social History Narrative   Not on file   Social Determinants of Health   Financial  Resource Strain: Low Risk  (12/13/2022)   Overall Financial Resource Strain (CARDIA)    Difficulty of Paying Living Expenses: Not hard at all  Food Insecurity: No Food Insecurity (12/13/2022)   Hunger Vital Sign    Worried About Running Out of Food in the Last Year: Never true    Ran Out of Food in the Last Year: Never true  Transportation Needs: No Transportation Needs (12/13/2022)   PRAPARE - Administrator, Civil Service (Medical): No    Lack of Transportation (Non-Medical): No  Physical Activity: Insufficiently Active (12/13/2022)   Exercise Vital Sign    Days of Exercise per Week: 3 days    Minutes of Exercise per Session: 30 min  Stress: No Stress Concern Present (12/13/2022)   [employer] of Occupational Health - Occupational Stress Questionnaire    Feeling of Stress : Not at all  Social Connections: Moderately Isolated (12/13/2022)   Social Connection and Isolation Panel [NHANES]    Frequency of Communication with Friends and Family: More than three times a week    Frequency of Social Gatherings with Friends and Family: Twice a week    Attends Religious Services: More than 4 times per year    Active Member of [employer] or Organizations: No    Attends Banker Meetings: Never    Marital Status: Widowed    Tobacco Counseling Counseling given: Not Answered   Clinical Intake:  Pre-visit preparation completed: Yes  Pain : No/denies pain     Nutritional Risks: None Diabetes: No  How often do you need to have someone help you when you read instructions, pamphlets, or other written materials from your doctor or pharmacy?: 1 - Never  Interpreter Needed?: No  Information entered by :: Kennedy Bucker, LPN   Activities of Daily Living    12/13/2022    9:23 AM 08/20/2022    9:35 AM  In your present state of health, do you have any difficulty performing the following activities:  Hearing? 0 0  Vision? 0 0  Difficulty concentrating or making decisions? 0 0   Walking or climbing stairs? 0 0  Dressing or bathing? 0 0  Doing errands, shopping? 0 0  Preparing Food and eating ? N   Using the Toilet? N   In the past six months, have you accidently leaked urine? N   Do you have problems with loss of bowel control? N   Managing your Medications? N   Managing your Finances? N   Housekeeping or managing your Housekeeping? N     Patient Care Team: Marjie Skiff, NP as PCP - General (Nurse Practitioner) Jim Like, RN as Registered Nurse Scarlett Presto,  RN (Inactive) as Registered Nurse  Indicate any recent Medical Services you may have received from other than [facility] providers in the past year (date may be approximate).     Assessment:   This is a routine wellness examination for Pamela Barrett.  Hearing/Vision screen Hearing Screening - Comments:: No aids Vision Screening - Comments:: Wears glasses- [employer]  Dietary issues and exercise activities discussed:     Goals Addressed             This Visit's Progress    DIET - EAT MORE FRUITS AND VEGETABLES         Depression Screen    12/13/2022    9:21 AM 10/07/2022   10:04 AM 08/20/2022    9:26 AM 02/19/2022    2:05 PM 12/07/2021    9:10 AM 08/17/2021    8:43 AM 02/16/2021   11:16 AM  PHQ 2/9 Scores  PHQ - 2 Score 0 0 0 0 0 0 0  PHQ- 9 Score 0 0 0 0  0 0    Fall Risk    12/13/2022    9:23 AM 10/07/2022   10:04 AM 08/20/2022    9:26 AM 02/19/2022    2:06 PM 12/07/2021    9:14 AM  Fall Risk   Falls in the past year? 0 0 0 0 0  Number falls in past yr: 0 0 0 0 0  Injury with Fall? 0 0 0 0 0  Risk for fall due to : No Fall Risks No Fall Risks No Fall Risks No Fall Risks   Follow up Falls prevention discussed;Falls evaluation completed  Falls evaluation completed Falls evaluation completed Falls evaluation completed;Education provided    MEDICARE RISK AT HOME:  Medicare Risk at Home - 12/13/22 0924     Any stairs in or around the home? Yes    If so, are there any without  handrails? No    Home free of loose throw rugs in walkways, pet beds, electrical cords, etc? Yes    Adequate lighting in your home to reduce risk of falls? Yes    Life alert? No    Use of a cane, walker or w/c? No    Grab bars in the bathroom? No    Shower chair or bench in shower? No    Elevated toilet seat or a handicapped toilet? No             TIMED UP AND GO:  Was the test performed?  No    Cognitive Function:        12/13/2022    9:24 AM 12/07/2021    9:06 AM  6CIT Screen  What Year? 0 points 0 points  What month? 0 points 0 points  What time? 0 points 0 points  Count back from 20 0 points 0 points  Months in reverse 0 points 0 points  Repeat phrase 0 points 0 points  Total Score 0 points 0 points    Immunizations Immunization History  Administered Date(s) Administered   Fluad Quad(high Dose 65+) 02/16/2021   Influenza,inj,Quad PF,6+ Mos 04/25/2019, 04/18/2020   Influenza-Unspecified 04/25/2019   PFIZER(Purple Top)SARS-COV-2 Vaccination 07/19/2019, 08/16/2019   PNEUMOCOCCAL CONJUGATE-20 02/19/2022   Pneumococcal Conjugate-13 02/16/2021   Tdap 04/25/2019    TDAP status: Up to date  Flu Vaccine status: Declined, Education has been provided regarding the importance of this vaccine but patient still declined. Advised may receive this vaccine at local pharmacy or Health Dept. Aware to provide a copy of  the vaccination record if obtained from local pharmacy or Health Dept. Verbalized acceptance and understanding.  Pneumococcal vaccine status: Up to date  Covid-19 vaccine status: Completed vaccines  Qualifies for Shingles Vaccine? Yes   Zostavax completed No   Shingrix Completed?: No.    Education has been provided regarding the importance of this vaccine. Patient has been advised to call insurance company to determine out of pocket expense if they have not yet received this vaccine. Advised may also receive vaccine at local pharmacy or Health Dept. Verbalized  acceptance and understanding.  Screening Tests Health Maintenance  Topic Date Due   Lung Cancer Screening  Never done   Zoster Vaccines- Shingrix (1 of 2) Never done   COVID-19 Vaccine (3 - 2023-24 season) 02/05/2022   INFLUENZA VACCINE  01/06/2023   Medicare Annual Wellness (AWV)  12/13/2023   MAMMOGRAM  09/09/2024   Colonoscopy  10/09/2024   DEXA SCAN  03/16/2026   DTaP/Tdap/Td (2 - Td or Tdap) 04/24/2029   Pneumonia Vaccine 76+ Years old  Completed   Hepatitis C Screening  Completed   HPV VACCINES  Aged Out    Health Maintenance  Health Maintenance Due  Topic Date Due   Lung Cancer Screening  Never done   Zoster Vaccines- Shingrix (1 of 2) Never done   COVID-19 Vaccine (3 - 2023-24 season) 02/05/2022    Colorectal cancer screening: Type of screening: Colonoscopy. Completed 10/10/19. Repeat every 3 years- 01/24/23 SCHEDULED  Mammogram status: Completed 09/10/22. Repeat every year  Bone Density status: Completed 03/16/21. Results reflect: Bone density results: NORMAL. Repeat every 5 years.  Lung Cancer Screening: (Low Dose CT Chest recommended if Age 40-80 years, 20 pack-year currently smoking OR have quit w/in 15years.) does qualify.   Lung Cancer Screening Referral: ORDERED ON 08/20/22  Additional Screening:  Hepatitis C Screening: does qualify; Completed 07/25/20  Vision Screening: Recommended annual ophthalmology exams for early detection of glaucoma and other disorders of the eye. Is the patient up to date with their annual eye exam?  Yes  Who is the provider or what is the name of the office in which the patient attends annual eye exams? [facility] If pt is not established with a provider, would they like to be referred to a provider to establish care? No .   Dental Screening: Recommended annual dental exams for proper oral hygiene    Community Resource Referral / Chronic Care Management: CRR required this visit?  No   CCM required this visit?  No     Plan:      I have personally reviewed and noted the following in the patient's chart:   Medical and social history Use of alcohol, tobacco or illicit drugs  Current medications and supplements including opioid prescriptions. Patient is not currently taking opioid prescriptions. Functional ability and status Nutritional status Physical activity Advanced directives List of other physicians Hospitalizations, surgeries, and ER visits in previous 12 months Vitals Screenings to include cognitive, depression, and falls Referrals and appointments  In addition, I have reviewed and discussed with patient certain preventive protocols, quality metrics, and best practice recommendations. A written personalized care plan for preventive services as well as general preventive health recommendations were provided to patient.     Hal Hope, LPN   02/10/6212   After Visit Summary: (MyChart) Due to this being a telephonic visit, the after visit summary with patients personalized plan was offered to patient via MyChart   Nurse Notes: none

## 2022-12-28 ENCOUNTER — Encounter: Payer: Self-pay | Admitting: Emergency Medicine

## 2023-01-14 ENCOUNTER — Encounter: Payer: Self-pay | Admitting: *Deleted

## 2023-01-17 ENCOUNTER — Encounter: Payer: Self-pay | Admitting: *Deleted

## 2023-01-24 ENCOUNTER — Encounter
Admission: RE | Disposition: A | Discharge status: Home / Self Care | Payer: Self-pay | Source: Home / Self Care | Attending: Gastroenterology

## 2023-01-24 ENCOUNTER — Encounter: Payer: Self-pay | Admitting: *Deleted

## 2023-01-24 ENCOUNTER — Ambulatory Visit
Admission: RE | Admit: 2023-01-24 | Discharge: 2023-01-24 | Disposition: A | Discharge status: Home / Self Care | Payer: Medicare Other | Attending: Gastroenterology | Admitting: Gastroenterology

## 2023-01-24 ENCOUNTER — Ambulatory Visit: Payer: Medicare Other | Admitting: Anesthesiology

## 2023-01-24 DIAGNOSIS — K529 Noninfective gastroenteritis and colitis, unspecified: Secondary | ICD-10-CM | POA: Insufficient documentation

## 2023-01-24 DIAGNOSIS — Z87891 Personal history of nicotine dependence: Secondary | ICD-10-CM | POA: Insufficient documentation

## 2023-01-24 DIAGNOSIS — K269 Duodenal ulcer, unspecified as acute or chronic, without hemorrhage or perforation: Secondary | ICD-10-CM | POA: Insufficient documentation

## 2023-01-24 DIAGNOSIS — Z86010 Personal history of colonic polyps: Secondary | ICD-10-CM | POA: Insufficient documentation

## 2023-01-24 DIAGNOSIS — K219 Gastro-esophageal reflux disease without esophagitis: Secondary | ICD-10-CM | POA: Diagnosis not present

## 2023-01-24 DIAGNOSIS — Z09 Encounter for follow-up examination after completed treatment for conditions other than malignant neoplasm: Secondary | ICD-10-CM | POA: Insufficient documentation

## 2023-01-24 DIAGNOSIS — K641 Second degree hemorrhoids: Secondary | ICD-10-CM | POA: Insufficient documentation

## 2023-01-24 DIAGNOSIS — Z800 Family history of malignant neoplasm of digestive organs: Secondary | ICD-10-CM | POA: Diagnosis not present

## 2023-01-24 DIAGNOSIS — Z1211 Encounter for screening for malignant neoplasm of colon: Secondary | ICD-10-CM | POA: Diagnosis not present

## 2023-01-24 DIAGNOSIS — F418 Other specified anxiety disorders: Secondary | ICD-10-CM | POA: Insufficient documentation

## 2023-01-24 DIAGNOSIS — Z79899 Other long term (current) drug therapy: Secondary | ICD-10-CM | POA: Diagnosis not present

## 2023-01-24 DIAGNOSIS — K2210 Ulcer of esophagus without bleeding: Secondary | ICD-10-CM | POA: Diagnosis not present

## 2023-01-24 DIAGNOSIS — D175 Benign lipomatous neoplasm of intra-abdominal organs: Secondary | ICD-10-CM | POA: Diagnosis not present

## 2023-01-24 DIAGNOSIS — K648 Other hemorrhoids: Secondary | ICD-10-CM | POA: Diagnosis not present

## 2023-01-24 DIAGNOSIS — K2980 Duodenitis without bleeding: Secondary | ICD-10-CM | POA: Diagnosis not present

## 2023-01-24 DIAGNOSIS — K2100 Gastro-esophageal reflux disease with esophagitis, without bleeding: Secondary | ICD-10-CM | POA: Diagnosis not present

## 2023-01-24 DIAGNOSIS — K449 Diaphragmatic hernia without obstruction or gangrene: Secondary | ICD-10-CM | POA: Diagnosis not present

## 2023-01-24 DIAGNOSIS — K209 Esophagitis, unspecified without bleeding: Secondary | ICD-10-CM | POA: Diagnosis not present

## 2023-01-24 HISTORY — DX: Insomnia, unspecified: G47.00

## 2023-01-24 HISTORY — PX: BIOPSY: SHX5522

## 2023-01-24 HISTORY — DX: Hyperlipidemia, unspecified: E78.5

## 2023-01-24 HISTORY — DX: Other dysphagia: R13.19

## 2023-01-24 HISTORY — PX: COLONOSCOPY WITH PROPOFOL: SHX5780

## 2023-01-24 HISTORY — DX: Vitamin D deficiency, unspecified: E55.9

## 2023-01-24 HISTORY — PX: ESOPHAGOGASTRODUODENOSCOPY (EGD) WITH PROPOFOL: SHX5813

## 2023-01-24 HISTORY — DX: Other specified disorders of bone density and structure, unspecified site: M85.80

## 2023-01-24 SURGERY — COLONOSCOPY WITH PROPOFOL
Anesthesia: General

## 2023-01-24 MED ORDER — GLYCOPYRROLATE 0.2 MG/ML IJ SOLN
INTRAMUSCULAR | Status: AC
  Filled 2023-01-24: qty 1

## 2023-01-24 MED ORDER — PROPOFOL 10 MG/ML IV BOLUS
INTRAVENOUS | Status: DC | PRN
  Administered 2023-01-24: 150 mg via INTRAVENOUS
  Administered 2023-01-24: 20 mg via INTRAVENOUS
  Administered 2023-01-24: 30 mg via INTRAVENOUS

## 2023-01-24 MED ORDER — SODIUM CHLORIDE 0.9 % IV SOLN
INTRAVENOUS | Status: DC
  Administered 2023-01-24: 20 mL/h via INTRAVENOUS

## 2023-01-24 MED ORDER — PROPOFOL 500 MG/50ML IV EMUL
INTRAVENOUS | Status: DC | PRN
  Administered 2023-01-24: 125 ug/kg/min via INTRAVENOUS

## 2023-01-24 MED ORDER — LIDOCAINE HCL (CARDIAC) PF 100 MG/5ML IV SOSY
PREFILLED_SYRINGE | INTRAVENOUS | Status: DC | PRN
  Administered 2023-01-24: 100 mg via INTRAVENOUS

## 2023-01-24 MED ORDER — PHENYLEPHRINE 80 MCG/ML (10ML) SYRINGE FOR IV PUSH (FOR BLOOD PRESSURE SUPPORT)
PREFILLED_SYRINGE | INTRAVENOUS | Status: AC
  Filled 2023-01-24: qty 10

## 2023-01-24 MED ORDER — DEXMEDETOMIDINE HCL IN NACL 80 MCG/20ML IV SOLN
INTRAVENOUS | Status: DC | PRN
Start: 2023-01-24 — End: 2023-01-24
  Administered 2023-01-24 (×2): 4 ug via INTRAVENOUS

## 2023-01-24 MED ORDER — LIDOCAINE HCL (PF) 2 % IJ SOLN
INTRAMUSCULAR | Status: AC
  Filled 2023-01-24: qty 5

## 2023-01-24 MED ORDER — GLYCOPYRROLATE 0.2 MG/ML IJ SOLN
INTRAMUSCULAR | Status: DC | PRN
  Administered 2023-01-24: .2 mg via INTRAVENOUS

## 2023-01-24 MED ORDER — PHENYLEPHRINE HCL (PRESSORS) 10 MG/ML IV SOLN
INTRAVENOUS | Status: DC | PRN
  Administered 2023-01-24 (×4): 100 ug via INTRAVENOUS

## 2023-01-24 NOTE — Op Note (Signed)
[facility] Patient Name: Pamela Barrett Procedure Date: 01/24/2023 11:36 AM MRN: [id number] Account #: [id number] Date of Birth: Jul 16, 1955 Admit Type: Outpatient Age: 67 Room: [facility] ENDO ROOM 3 Gender: Female Note Status: Finalized Instrument Name: Upper Endoscope [id number] Procedure:             Upper GI endoscopy Indications:           Dysphagia Providers:             Eather Colas MD, MD Referring MD:          Dorie Rank. Harvest Dark (Referring MD) Medicines:             Monitored Anesthesia Care Complications:         No immediate complications. Estimated blood loss:                         Minimal. Procedure:             Pre-Anesthesia Assessment:                        - Prior to the procedure, a History and Physical was                         performed, and patient medications and allergies were                         reviewed. The patient is competent. The risks and                         benefits of the procedure and the sedation options and                         risks were discussed with the patient. All questions                         were answered and informed consent was obtained.                         Patient identification and proposed procedure were                         verified by the physician, the nurse, the                         anesthesiologist, the anesthetist and the technician                         in the endoscopy suite. Mental Status Examination:                         alert and oriented. Airway Examination: normal                         oropharyngeal airway and neck mobility. Respiratory                         Examination: clear to auscultation. CV Examination:  normal. Prophylactic Antibiotics: The patient does not                         require prophylactic antibiotics. Prior                         Anticoagulants: The patient has taken no anticoagulant                          or antiplatelet agents. ASA Grade Assessment: II - A                         patient with mild systemic disease. After reviewing                         the risks and benefits, the patient was deemed in                         satisfactory condition to undergo the procedure. The                         anesthesia plan was to use monitored anesthesia care                         (MAC). Immediately prior to administration of                         medications, the patient was re-assessed for adequacy                         to receive sedatives. The heart rate, respiratory                         rate, oxygen saturations, blood pressure, adequacy of                         pulmonary ventilation, and response to care were                         monitored throughout the procedure. The physical                         status of the patient was re-assessed after the                         procedure.                        After obtaining informed consent, the endoscope was                         passed under direct vision. Throughout the procedure,                         the patient's blood pressure, pulse, and oxygen                         saturations were monitored continuously. The Endoscope  was introduced through the mouth, and advanced to the                         second part of duodenum. The upper GI endoscopy was                         accomplished without difficulty. The patient tolerated                         the procedure well. Findings:      LA Grade C (one or more mucosal breaks continuous between tops of 2 or       more mucosal folds, less than 75% circumference) esophagitis with no       bleeding was found. Biopsies were taken with a cold forceps for       histology. Estimated blood loss was minimal.      One superficial esophageal ulcer with no bleeding and no stigmata of       recent bleeding was found. The lesion was 3 mm in largest dimension.        This was associated with the esophagitis that was seen.      A small hiatal hernia was present.      The entire examined stomach was normal.      Multiple diffuse erosions without bleeding were found in the duodenal       bulb. Biopsies were taken with a cold forceps for histology. Estimated       blood loss was minimal.      The exam of the duodenum was otherwise normal. Impression:            - LA Grade C esophagitis with no bleeding. Biopsied.                        - Esophageal ulcer with no bleeding and no stigmata of                         recent bleeding.                        - Small hiatal hernia.                        - Normal stomach.                        - Duodenal erosions without bleeding. Biopsied. Recommendation:        - Discharge patient to home.                        - Resume previous diet.                        - Continue present medications.                        - Await pathology results.                        - Use a proton pump inhibitor PO BID for 3 months.                        -  Repeat upper endoscopy in 3 months to check healing.                        - Return to referring physician as previously                         scheduled. Procedure Code(s):     --- Professional ---                        [phone], Esophagogastroduodenoscopy, flexible,                         transoral; with biopsy, single or multiple Diagnosis Code(s):     --- Professional ---                        K20.90, Esophagitis, unspecified without bleeding                        K22.10, Ulcer of esophagus without bleeding                        K44.9, Diaphragmatic hernia without obstruction or                         gangrene                        K26.9, Duodenal ulcer, unspecified as acute or                         chronic, without hemorrhage or perforation                        R13.10, Dysphagia, unspecified CPT copyright 2022 American Medical Association. All rights  reserved. The codes documented in this report are preliminary and upon coder review may  be revised to meet current compliance requirements. Eather Colas MD, MD 01/24/2023 12:15:45 PM Number of Addenda: 0 Note Initiated On: 01/24/2023 11:36 AM Estimated Blood Loss:  Estimated blood loss was minimal.      [facility]

## 2023-01-24 NOTE — H&P (Signed)
Outpatient short stay form Pre-procedure 01/24/2023  Regis Bill, MD  Primary Physician: Marjie Skiff, NP  Reason for visit:  Dysphagia/History of colitis  History of present illness:    67 y/o lady with history of hiatal hernia repair with baseline dysphagia that has worsened recently. Complains of both solid/liquid dysphagia. No blood thinners. Sister with colon cancer at age 56. Here for both EGD/Colonoscopy. On last colonoscopy in 2021 she had a scattered colitis of unknown cause. Denies any lower GI symptoms. No other abdominal surgeries besides the hiatal hernia repair.    Current Facility-Administered Medications:    0.9 %  sodium chloride infusion, , Intravenous, Continuous, Aashvi Rezabek, Rossie Muskrat, MD, Last Rate: 20 mL/hr at 01/24/23 1012, 20 mL/hr at 01/24/23 1012  Medications Prior to Admission  Medication Sig Dispense Refill Last Dose   aspirin EC 81 MG tablet Take 81 mg by mouth daily.   Past Week   busPIRone (BUSPAR) 5 MG tablet Take 1 tablet (5 mg total) by mouth as needed (for anxiety). Take twice daily as needed by mouth for anxiety. 180 tablet 4 Past Week   cholecalciferol (VITAMIN D3) 25 MCG (1000 UNIT) tablet Take 1,000 Units by mouth daily.   Past Week   citalopram (CELEXA) 20 MG tablet TAKE 1 TABLET(20 MG) BY MOUTH DAILY 90 tablet 4 01/24/2023 at 0500   ondansetron (ZOFRAN ODT) 4 MG disintegrating tablet Take 1-2 tablets (4-8 mg total) by mouth every 8 (eight) hours as needed for nausea or vomiting. 60 tablet 3 Past Week   pantoprazole (PROTONIX) 40 MG tablet TAKE 1 TABLET(40 MG) BY MOUTH DAILY 90 tablet 0 Past Week   QUEtiapine (SEROQUEL) 25 MG tablet TAKE 1 TABLET(25 MG) BY MOUTH AT BEDTIME 90 tablet 4 Past Week     No Known Allergies   Past Medical History:  Diagnosis Date   Abnormal uterine bleeding (AUB) 05/04/2016   Last Assessment & Plan:   Appears to be AUB-P vs AUB-M; likely cervical polyp visualized on speculum exam and EMB performed  TVUS  ordered  My suspicion is that this is most likely a benign process. As she does have a 30 pack year history, I counseled the patient extensively on smoking cessation, as this is her biggest risk factor for chronic disease.   Endometrial Biopsy Procedure Note  Urine p   Anxiety    COVID 06/06/2020   Depression    Esophageal dysphagia    Esophageal dysphagia    GERD (gastroesophageal reflux disease)    Hyperlipidemia    Insomnia    Osteopenia    Vitamin D deficiency     Review of systems:  Otherwise negative.    Physical Exam  Gen: Alert, oriented. Appears stated age.  HEENT: PERRLA. Lungs: No respiratory distress CV: RRR Abd: soft, benign, no masses Ext: No edema    Planned procedures: Proceed with EGD/colonoscopy. The patient understands the nature of the planned procedure, indications, risks, alternatives and potential complications including but not limited to bleeding, infection, perforation, damage to internal organs and possible oversedation/side effects from anesthesia. The patient agrees and gives consent to proceed.  Please refer to procedure notes for findings, recommendations and patient disposition/instructions.     Regis Bill, MD [facility]

## 2023-01-24 NOTE — Op Note (Signed)
[facility] Patient Name: Pamela Barrett Procedure Date: 01/24/2023 11:33 AM MRN: [id number] Account #: [id number] Date of Birth: Jun 16, 1955 Admit Type: Outpatient Age: 67 Room: [facility] ENDO ROOM 3 Gender: Female Note Status: Supervisor Override Instrument Name: Prentice Docker [id number] Procedure:             Colonoscopy Indications:           High risk colon cancer surveillance: Personal history                         of colonic polyps, Follow-up of colitis Providers:             Eather Colas MD, MD Referring MD:          Dorie Rank. Harvest Dark (Referring MD) Medicines:             Monitored Anesthesia Care Complications:         No immediate complications. Procedure:             Pre-Anesthesia Assessment:                        - Prior to the procedure, a History and Physical was                         performed, and patient medications and allergies were                         reviewed. The patient is competent. The risks and                         benefits of the procedure and the sedation options and                         risks were discussed with the patient. All questions                         were answered and informed consent was obtained.                         Patient identification and proposed procedure were                         verified by the physician, the nurse, the                         anesthesiologist, the anesthetist and the technician                         in the endoscopy suite. Mental Status Examination:                         alert and oriented. Airway Examination: normal                         oropharyngeal airway and neck mobility. Respiratory                         Examination: clear to auscultation. CV Examination:  normal. Prophylactic Antibiotics: The patient does not                         require prophylactic antibiotics. Prior                         Anticoagulants: The patient  has taken no anticoagulant                         or antiplatelet agents. ASA Grade Assessment: II - A                         patient with mild systemic disease. After reviewing                         the risks and benefits, the patient was deemed in                         satisfactory condition to undergo the procedure. The                         anesthesia plan was to use monitored anesthesia care                         (MAC). Immediately prior to administration of                         medications, the patient was re-assessed for adequacy                         to receive sedatives. The heart rate, respiratory                         rate, oxygen saturations, blood pressure, adequacy of                         pulmonary ventilation, and response to care were                         monitored throughout the procedure. The physical                         status of the patient was re-assessed after the                         procedure.                        After obtaining informed consent, the colonoscope was                         passed under direct vision. Throughout the procedure,                         the patient's blood pressure, pulse, and oxygen                         saturations were monitored continuously. The  Colonoscope was introduced through the anus and                         advanced to the the terminal ileum. The colonoscopy                         was performed without difficulty. The patient                         tolerated the procedure well. The quality of the bowel                         preparation was adequate to identify polyps. The                         terminal ileum, ileocecal valve, appendiceal orifice,                         and rectum were photographed. Findings:      The perianal and digital rectal examinations were normal.      The terminal ileum appeared normal.      Normal mucosa was found in the entire  colon.      There was a small lipoma, 10 mm in diameter, in the proximal transverse       colon.      Internal hemorrhoids were found during retroflexion. The hemorrhoids       were Grade II (internal hemorrhoids that prolapse but reduce       spontaneously).      The exam was otherwise without abnormality on direct and retroflexion       views. Impression:            - The examined portion of the ileum was normal.                        - Normal mucosa in the entire examined colon.                        - Small lipoma in the proximal transverse colon.                        - Internal hemorrhoids.                        - The examination was otherwise normal on direct and                         retroflexion views.                        - No specimens collected. Recommendation:        - Discharge patient to home.                        - Resume previous diet.                        - Continue present medications.                        - Repeat colonoscopy in 5  years for screening purposes.                        - Return to referring physician as previously                         scheduled. Procedure Code(s):     --- Professional ---                        [phone], Colonoscopy, flexible; diagnostic, including                         collection of specimen(s) by brushing or washing, when                         performed (separate procedure) Diagnosis Code(s):     --- Professional ---                        K64.1, Second degree hemorrhoids                        D17.5, Benign lipomatous neoplasm of intra-abdominal                         organs                        K52.9, Noninfective gastroenteritis and colitis,                         unspecified CPT copyright 2022 American Medical Association. All rights reserved. The codes documented in this report are preliminary and upon coder review may  be revised to meet current compliance requirements. Eather Colas MD,  MD 01/24/2023 12:18:55 PM Number of Addenda: 0 Note Initiated On: 01/24/2023 11:33 AM Scope Withdrawal Time: 0 hours 10 minutes 48 seconds  Total Procedure Duration: 0 hours 13 minutes 29 seconds  Estimated Blood Loss:  Estimated blood loss: none.      [facility]

## 2023-01-24 NOTE — Interval H&P Note (Signed)
History and Physical Interval Note:  01/24/2023 11:32 AM  Pamela Barrett  has presented today for surgery, with the diagnosis of esophageal dysphagia,hx of adenomatous polyp.  The various methods of treatment have been discussed with the patient and family. After consideration of risks, benefits and other options for treatment, the patient has consented to  Procedure(s): COLONOSCOPY WITH PROPOFOL (N/A) ESOPHAGOGASTRODUODENOSCOPY (EGD) WITH PROPOFOL (N/A) as a surgical intervention.  The patient's history has been reviewed, patient examined, no change in status, stable for surgery.  I have reviewed the patient's chart and labs.  Questions were answered to the patient's satisfaction.     Regis Bill  Ok to proceed with EGD/Colonoscopy

## 2023-01-24 NOTE — Transfer of Care (Signed)
Immediate Anesthesia Transfer of Care Note  Patient: Pamela Barrett  Procedure(s) Performed: COLONOSCOPY WITH PROPOFOL ESOPHAGOGASTRODUODENOSCOPY (EGD) WITH PROPOFOL BIOPSY  Patient Location: PACU  Anesthesia Type:General  Level of Consciousness: drowsy  Airway & Oxygen Therapy: Patient Spontanous Breathing  Post-op Assessment: Report given to RN and Post -op Vital signs reviewed and stable  Post vital signs: Reviewed and stable  Last Vitals:  Vitals Value Taken Time  BP 97/64 01/24/23 1217  Temp 35.8 C 01/24/23 1217  Pulse 104 01/24/23 1222  Resp 11 01/24/23 1222  SpO2 97 % 01/24/23 1222  Vitals shown include unfiled device data.  Last Pain:  Vitals:   01/24/23 1217  TempSrc:   PainSc: Asleep         Complications: No notable events documented.

## 2023-01-24 NOTE — Anesthesia Preprocedure Evaluation (Signed)
Anesthesia Evaluation  Patient identified by MRN, date of birth, ID band Patient awake    Reviewed: Allergy & Precautions, H&P , NPO status , Patient's Chart, lab work & pertinent test results, reviewed documented beta blocker date and time   History of Anesthesia Complications Negative for: history of anesthetic complications  Airway Mallampati: II   Neck ROM: full    Dental  (+) Poor Dentition, Dental Advidsory Given, Edentulous Upper, Missing   Pulmonary neg pulmonary ROS, former smoker   Pulmonary exam normal        Cardiovascular Exercise Tolerance: Good negative cardio ROS Normal cardiovascular exam Rhythm:regular Rate:Normal     Neuro/Psych  PSYCHIATRIC DISORDERS Anxiety Depression    negative neurological ROS     GI/Hepatic Neg liver ROS,GERD  Medicated,,  Endo/Other  negative endocrine ROS    Renal/GU negative Renal ROS  negative genitourinary   Musculoskeletal   Abdominal   Peds  Hematology negative hematology ROS (+)   Anesthesia Other Findings Past Medical History: 05/04/2016: Abnormal uterine bleeding (AUB)     Comment:  Last Assessment & Plan:   Appears to be AUB-P vs AUB-M;              likely cervical polyp visualized on speculum exam and EMB              performed  TVUS ordered  My suspicion is that this is               most likely a benign process. As she does have a 30 pack               year history, I counseled the patient extensively on               smoking cessation, as this is her biggest risk factor for              chronic disease.   Endometrial Biopsy Procedure Note                Urine p No date: Anxiety No date: Depression No date: GERD (gastroesophageal reflux disease) Past Surgical History: 20+ yrs ago: BREAST EXCISIONAL BIOPSY; Left     Comment:  neg No date: COLONOSCOPY WITH ESOPHAGOGASTRODUODENOSCOPY (EGD) 2000: ESOPHAGOGASTRIC FUNDOPLICATION BMI    Body Mass Index:  30.90 kg/m     Reproductive/Obstetrics negative OB ROS                             Anesthesia Physical Anesthesia Plan  ASA: 2  Anesthesia Plan: General   Post-op Pain Management:    Induction: Intravenous  PONV Risk Score and Plan: 3 and Propofol infusion and TIVA  Airway Management Planned: Natural Airway and Nasal Cannula  Additional Equipment:   Intra-op Plan:   Post-operative Plan:   Informed Consent: I have reviewed the patients History and Physical, chart, labs and discussed the procedure including the risks, benefits and alternatives for the proposed anesthesia with the patient or authorized representative who has indicated his/her understanding and acceptance.     Dental Advisory Given  Plan Discussed with: CRNA  Anesthesia Plan Comments:         Anesthesia Quick Evaluation

## 2023-01-25 ENCOUNTER — Encounter: Payer: Self-pay | Admitting: Gastroenterology

## 2023-01-31 ENCOUNTER — Telehealth: Payer: Self-pay | Admitting: Nurse Practitioner

## 2023-01-31 NOTE — Telephone Encounter (Addendum)
Medication Refill - Medication: pantoprazole (PROTONIX) 40 MG tablet   Pt stated she had her endoscopy done on 08/19. Having another one done in 3 months, she was told to double up on her medication--one tablet in the morning and one at night.  Stated she is going on vacation in the first of September and will be out of her medication. Pt requesting refill stated GI should have sent information to PCP. Please advise.  Has the patient contacted their pharmacy? No. (Agent: If no, request that the patient contact the pharmacy for the refill. If patient does not wish to contact the pharmacy document the reason why and proceed with request.)   Preferred Pharmacy (with phone number or street name):  [facility][id number] [place], [place] - [address] AT [facility] OF [address]  [address] [place] [address]  Phone: [phone] Fax: [phone]  Hours: Not open 24 hours   Has the patient been seen for an appointment in the last year OR does the patient have an upcoming appointment? Yes.    Agent: Please be advised that RX refills may take up to 3 business days. We ask that you follow-up with your pharmacy.

## 2023-02-01 MED ORDER — PANTOPRAZOLE SODIUM 40 MG PO TBEC: 40.0000 mg | DELAYED_RELEASE_TABLET | Freq: Two times a day (BID) | ORAL | 0 refills | Status: DC

## 2023-02-01 NOTE — Addendum Note (Signed)
Addended by: Marjie Skiff on: 02/01/2023 06:37 PM   Modules accepted: Orders

## 2023-02-02 NOTE — Anesthesia Postprocedure Evaluation (Signed)
Anesthesia Post Note  Patient: Pamela Barrett  Procedure(s) Performed: COLONOSCOPY WITH PROPOFOL ESOPHAGOGASTRODUODENOSCOPY (EGD) WITH PROPOFOL BIOPSY  Patient location during evaluation: Endoscopy Anesthesia Type: General Level of consciousness: awake and alert Pain management: pain level controlled Vital Signs Assessment: post-procedure vital signs reviewed and stable Respiratory status: spontaneous breathing, nonlabored ventilation, respiratory function stable and patient connected to nasal cannula oxygen Cardiovascular status: blood pressure returned to baseline and stable Postop Assessment: no apparent nausea or vomiting Anesthetic complications: no   No notable events documented.   Last Vitals:  Vitals:   01/24/23 1227 01/24/23 1237  BP: 94/65 118/89  Pulse: 99 97  Resp: (!) 29 16  Temp:    SpO2: 95% 98%    Last Pain:  Vitals:   01/25/23 0746  TempSrc:   PainSc: 0-No pain                 Lenard Simmer

## 2023-02-02 NOTE — Telephone Encounter (Signed)
Called and notified patient that the prescription has been sent in as requested.

## 2023-02-06 ENCOUNTER — Other Ambulatory Visit: Payer: Self-pay | Admitting: Nurse Practitioner

## 2023-02-08 NOTE — Telephone Encounter (Signed)
Requested Prescriptions  Pending Prescriptions Disp Refills   pantoprazole (PROTONIX) 40 MG tablet [Pharmacy Med Name: PANTOPRAZOLE 40MG  TABLETS] 90 tablet 0    Sig: TAKE 1 TABLET(40 MG) BY MOUTH DAILY     Gastroenterology: Proton Pump Inhibitors Passed - 02/06/2023  3:15 AM      Passed - Valid encounter within last 12 months    Recent Outpatient Visits           4 months ago Gastroesophageal reflux disease with esophagitis without hemorrhage   [place] [facility] [place], Corrie Dandy T, NP   5 months ago Anxiety   [place] [facility] [place], Pinnacle T, NP   11 months ago Anxiety   [place] [facility] [place], Corrie Dandy T, NP   1 year ago Anxiety   [place] [facility] [place], Corrie Dandy T, NP   1 year ago Anxiety   [place] [facility] [place], Dorie Rank, NP       Future Appointments             In 1 week [place], Dorie Rank, NP  [facility]

## 2023-02-19 NOTE — Patient Instructions (Signed)
Be Involved in Caring For Your Health:  Taking Medications When medications are taken as directed, they can greatly improve your health. But if they are not taken as prescribed, they may not work. In some cases, not taking them correctly can be harmful. To help ensure your treatment remains effective and safe, understand your medications and how to take them. Bring your medications to each visit for review by your provider.  Your lab results, notes, and after visit summary will be available on My Chart. We strongly encourage you to use this feature. If lab results are abnormal the clinic will contact you with the appropriate steps. If the clinic does not contact you assume the results are satisfactory. You can always view your results on My Chart. If you have questions regarding your health or results, please contact the clinic during office hours. You can also ask questions on My Chart.  We at [facility] are grateful that you chose [place] to provide your care. We strive to provide evidence-based and compassionate care and are always looking for feedback. If you get a survey from the clinic please complete this so we can hear your opinions.  Insomnia Insomnia is a sleep disorder that makes it difficult to fall asleep or stay asleep. Insomnia can cause fatigue, low energy, difficulty concentrating, mood swings, and poor performance at work or school. There are three different ways to classify insomnia: Difficulty falling asleep. Difficulty staying asleep. Waking up too early in the morning. Any type of insomnia can be long-term (chronic) or short-term (acute). Both are common. Short-term insomnia usually lasts for 3 months or less. Chronic insomnia occurs at least three times a week for longer than 3 months. What are the causes? Insomnia may be caused by another condition, situation, or substance, such as: Having certain mental health conditions, such as anxiety and depression. Using  caffeine, alcohol, tobacco, or drugs. Having gastrointestinal conditions, such as gastroesophageal reflux disease (GERD). Having certain medical conditions. These include: Asthma. Alzheimer's disease. Stroke. Chronic pain. An overactive thyroid gland (hyperthyroidism). Other sleep disorders, such as restless legs syndrome and sleep apnea. Menopause. Sometimes, the cause of insomnia may not be known. What increases the risk? Risk factors for insomnia include: Gender. Females are affected more often than males. Age. Insomnia is more common as people get older. Stress and certain medical and mental health conditions. Lack of exercise. Having an irregular work schedule. This may include working night shifts and traveling between different time zones. What are the signs or symptoms? If you have insomnia, the main symptom is having trouble falling asleep or having trouble staying asleep. This may lead to other symptoms, such as: Feeling tired or having low energy. Feeling nervous about going to sleep. Not feeling rested in the morning. Having trouble concentrating. Feeling irritable, anxious, or depressed. How is this diagnosed? This condition may be diagnosed based on: Your symptoms and medical history. Your health care provider may ask about: Your sleep habits. Any medical conditions you have. Your mental health. A physical exam. How is this treated? Treatment for insomnia depends on the cause. Treatment may focus on treating an underlying condition that is causing the insomnia. Treatment may also include: Medicines to help you sleep. Counseling or therapy. Lifestyle adjustments to help you sleep better. Follow these instructions at home: Eating and drinking  Limit or avoid alcohol, caffeinated beverages, and products that contain nicotine and tobacco, especially close to bedtime. These can disrupt your sleep. Do not eat a large  meal or eat spicy foods right before bedtime. This  can lead to digestive discomfort that can make it hard for you to sleep. Sleep habits  Keep a sleep diary to help you and your health care provider figure out what could be causing your insomnia. Write down: When you sleep. When you wake up during the night. How well you sleep and how rested you feel the next day. Any side effects of medicines you are taking. What you eat and drink. Make your bedroom a dark, comfortable place where it is easy to fall asleep. Put up shades or blackout curtains to block light from outside. Use a white noise machine to block noise. Keep the temperature cool. Limit screen use before bedtime. This includes: Not watching TV. Not using your smartphone, tablet, or computer. Stick to a routine that includes going to bed and waking up at the same times every day and night. This can help you fall asleep faster. Consider making a quiet activity, such as reading, part of your nighttime routine. Try to avoid taking naps during the day so that you sleep better at night. Get out of bed if you are still awake after 15 minutes of trying to sleep. Keep the lights down, but try reading or doing a quiet activity. When you feel sleepy, go back to bed. General instructions Take over-the-counter and prescription medicines only as told by your health care provider. Exercise regularly as told by your health care provider. However, avoid exercising in the hours right before bedtime. Use relaxation techniques to manage stress. Ask your health care provider to suggest some techniques that may work well for you. These may include: Breathing exercises. Routines to release muscle tension. Visualizing peaceful scenes. Make sure that you drive carefully. Do not drive if you feel very sleepy. Keep all follow-up visits. This is important. Contact a health care provider if: You are tired throughout the day. You have trouble in your daily routine due to sleepiness. You continue to have  sleep problems, or your sleep problems get worse. Get help right away if: You have thoughts about hurting yourself or someone else. Get help right away if you feel like you may hurt yourself or others, or have thoughts about taking your own life. Go to your nearest emergency room or: Call 911. Call the National Suicide Prevention Lifeline at (915)283-0277 or 988. This is open 24 hours a day. Text the Crisis Text Line at 757-164-8188. Summary Insomnia is a sleep disorder that makes it difficult to fall asleep or stay asleep. Insomnia can be long-term (chronic) or short-term (acute). Treatment for insomnia depends on the cause. Treatment may focus on treating an underlying condition that is causing the insomnia. Keep a sleep diary to help you and your health care provider figure out what could be causing your insomnia. This information is not intended to replace advice given to you by your health care provider. Make sure you discuss any questions you have with your health care provider. Document Revised: 05/04/2021 Document Reviewed: 05/04/2021 Elsevier Patient Education  2024 [employer].

## 2023-02-21 ENCOUNTER — Ambulatory Visit (INDEPENDENT_AMBULATORY_CARE_PROVIDER_SITE_OTHER): Payer: Medicare Other | Admitting: Nurse Practitioner

## 2023-02-21 ENCOUNTER — Encounter: Payer: Self-pay | Admitting: Nurse Practitioner

## 2023-02-21 VITALS — BP 132/80 | HR 84 | Temp 98.1°F | Ht 64.5 in | Wt 170.4 lb

## 2023-02-21 DIAGNOSIS — F5104 Psychophysiologic insomnia: Secondary | ICD-10-CM

## 2023-02-21 DIAGNOSIS — Z23 Encounter for immunization: Secondary | ICD-10-CM

## 2023-02-21 DIAGNOSIS — F419 Anxiety disorder, unspecified: Secondary | ICD-10-CM | POA: Diagnosis not present

## 2023-02-21 DIAGNOSIS — Z6828 Body mass index (BMI) 28.0-28.9, adult: Secondary | ICD-10-CM | POA: Diagnosis not present

## 2023-02-21 NOTE — Assessment & Plan Note (Signed)
Chronic and remains stable.  PHQ9 = 0 and GAD7 = 0.  Will continue Celexa and Buspar to take as needed. She denies SI/HI.  Discussed meditation for anxiety.  Will follow-up on mood in 6 months.

## 2023-02-21 NOTE — Progress Notes (Signed)
BP 132/80 (BP Location: Left Arm, Patient Position: Sitting, Cuff Size: Normal)   Pulse 84   Temp 98.1 F (36.7 C) (Oral)   Ht 5' 4.5" (1.638 m)   Wt 170 lb 6.4 oz (77.3 kg)   SpO2 97%   BMI 28.80 kg/m    Subjective:    Patient ID: Judithann Sheen, female    DOB: 09-23-1955, 67 y.o.   MRN: [id number]  HPI: MARIADELROSARIO ELY is a 67 y.o. female  Chief Complaint  Patient presents with   Anxiety   Insomnia   ANXIETY/STRESS Taking Celexa 20 MG and Buspar as needed -- does not use often.  Takes Seroquel at night on occasion for sleep, takes about 2-3 times a week. Duration:stable Anxious mood: no  Excessive worrying: no Irritability: no  Sweating: no Nausea: no Palpitations:no Hyperventilation: no Panic attacks: no Agoraphobia: no  Obscessions/compulsions: no Depressed mood: no    03/12/23    9:15 AM 12/13/2022    9:21 AM 10/07/2022   10:04 AM 08/20/2022    9:26 AM 02/19/2022    2:05 PM  Depression screen PHQ 2/9  Decreased Interest 0 0 0 0 0  Down, Depressed, Hopeless 0 0 0 0 0  PHQ - 2 Score 0 0 0 0 0  Altered sleeping 0 0 0 0 0  Tired, decreased energy 0 0 0 0 0  Change in appetite 0 0 0 0 0  Feeling bad or failure about yourself  0 0 0 0 0  Trouble concentrating 0 0 0 0 0  Moving slowly or fidgety/restless 0 0 0 0 0  Suicidal thoughts 0 0 0 0 0  PHQ-9 Score 0 0 0 0 0  Difficult doing work/chores Not difficult at all Not difficult at all Not difficult at all Not difficult at all Not difficult at all  Anhedonia: no Weight changes: no Insomnia: yes hard to fall asleep  Hypersomnia: no Fatigue/loss of energy: no Feelings of worthlessness: no Feelings of guilt: no Impaired concentration/indecisiveness: no Suicidal ideations: no  Crying spells: no Recent Stressors/Life Changes: no   Relationship problems: no   Family stress: no     Financial stress: no    Job stress: no    Recent death/loss: no    2023/03/12    9:15 AM 10/07/2022   10:04 AM 08/20/2022     9:26 AM 02/19/2022    2:06 PM  GAD 7 : Generalized Anxiety Score  Nervous, Anxious, on Edge 0 0 0 0  Control/stop worrying 0 0 0 0  Worry too much - different things 0 0 0 0  Trouble relaxing 0 0 0 0  Restless 0 0 0 0  Easily annoyed or irritable 0 0 0 0  Afraid - awful might happen 0 0 0 0  Total GAD 7 Score 0 0 0 0  Anxiety Difficulty Not difficult at all Not difficult at all Not difficult at all Not difficult at all    Relevant past medical, surgical, family and social history reviewed and updated as indicated. Interim medical history since our last visit reviewed. Allergies and medications reviewed and updated.  Review of Systems  Constitutional:  Negative for activity change, appetite change, diaphoresis, fatigue and fever.  Respiratory:  Negative for cough, chest tightness and shortness of breath.   Cardiovascular:  Negative for chest pain, palpitations and leg swelling.  Gastrointestinal: Negative.   Neurological: Negative.   Psychiatric/Behavioral:  Negative for decreased concentration, self-injury, sleep disturbance and suicidal  ideas. The patient is not nervous/anxious.     Per HPI unless specifically indicated above     Objective:    BP 132/80 (BP Location: Left Arm, Patient Position: Sitting, Cuff Size: Normal)   Pulse 84   Temp 98.1 F (36.7 C) (Oral)   Ht 5' 4.5" (1.638 m)   Wt 170 lb 6.4 oz (77.3 kg)   SpO2 97%   BMI 28.80 kg/m   Wt Readings from Last 3 Encounters:  02/21/23 170 lb 6.4 oz (77.3 kg)  01/24/23 166 lb (75.3 kg)  12/13/22 169 lb (76.7 kg)    Physical Exam Vitals and nursing note reviewed.  Constitutional:      General: She is awake. She is not in acute distress.    Appearance: She is well-developed and well-groomed. She is not ill-appearing.  HENT:     Head: Normocephalic.     Right Ear: Hearing normal.     Left Ear: Hearing normal.  Eyes:     General: Lids are normal.        Right eye: No discharge.        Left eye: No  discharge.     Conjunctiva/sclera: Conjunctivae normal.     Pupils: Pupils are equal, round, and reactive to light.  Neck:     Vascular: No carotid bruit.  Cardiovascular:     Rate and Rhythm: Normal rate and regular rhythm.     Heart sounds: Normal heart sounds. No murmur heard.    No gallop.  Pulmonary:     Effort: Pulmonary effort is normal. No accessory muscle usage or respiratory distress.     Breath sounds: Normal breath sounds.  Abdominal:     General: Bowel sounds are normal.     Palpations: Abdomen is soft.  Musculoskeletal:     Cervical back: Normal range of motion and neck supple.     Right lower leg: No edema.     Left lower leg: No edema.  Skin:    General: Skin is warm and dry.  Neurological:     Mental Status: She is alert and oriented to person, place, and time.  Psychiatric:        Attention and Perception: Attention normal.        Mood and Affect: Mood normal.        Speech: Speech normal.        Behavior: Behavior normal. Behavior is cooperative.        Thought Content: Thought content normal.      Assessment & Plan:   Problem List Items Addressed This Visit       Other   Anxiety - Primary    Chronic and remains stable.  PHQ9 = 0 and GAD7 = 0.  Will continue Celexa and Buspar to take as needed. She denies SI/HI.  Discussed meditation for anxiety.  Will follow-up on mood in 6 months.       BMI 28.0-28.9,adult    Recommended eating smaller high protein, low fat meals more frequently and exercising 30 mins a day 5 times a week with a goal of 10-15lb weight loss in the next 3 months. Patient voiced their understanding and motivation to adhere to these recommendations.       Insomnia    Chronic, stable.  Continue Seroquel 25 MG at night as needed.  Educated on medication.  Recommend focus on sleep hygiene techniques and educated on these.  Return in 6 months.      Other Visit Diagnoses  Flu vaccine need       Flu vaccine provided in office  today, educated patient.   Relevant Orders   Flu Vaccine Trivalent High Dose (Fluad) (Completed)        Follow up plan: Return in about 6 months (around 08/21/2023) for Annual physical after 08/20/23.

## 2023-02-21 NOTE — Assessment & Plan Note (Signed)
Chronic, stable.  Continue Seroquel 25 MG at night as needed.  Educated on medication.  Recommend focus on sleep hygiene techniques and educated on these.  Return in 6 months.

## 2023-02-21 NOTE — Assessment & Plan Note (Signed)
Recommended eating smaller high protein, low fat meals more frequently and exercising 30 mins a day 5 times a week with a goal of 10-15lb weight loss in the next 3 months. Patient voiced their understanding and motivation to adhere to these recommendations.

## 2023-03-12 ENCOUNTER — Other Ambulatory Visit: Payer: Self-pay | Admitting: Nurse Practitioner

## 2023-03-14 NOTE — Telephone Encounter (Signed)
Requested Prescriptions  Refused Prescriptions Disp Refills   citalopram (CELEXA) 20 MG tablet [Pharmacy Med Name: CITALOPRAM 20MG  TABLETS] 90 tablet 4    Sig: TAKE 1 TABLET(20 MG) BY MOUTH DAILY     Psychiatry:  Antidepressants - SSRI Passed - 03/12/2023  9:26 AM      Passed - Valid encounter within last 6 months    Recent Outpatient Visits           3 weeks ago Anxiety   [place] [facility] [place], Almond T, NP   5 months ago Gastroesophageal reflux disease with esophagitis without hemorrhage   [place] [facility] [place], Corrie Dandy T, NP   6 months ago Anxiety   [place] [facility] [place], Corrie Dandy T, NP   1 year ago Anxiety   [place] [facility], Corrie Dandy T, NP   1 year ago Anxiety   [place] [facility] [place], Dorie Rank, NP       Future Appointments             In 5 months [place], Dorie Rank, NP [place] [facility]

## 2023-03-15 ENCOUNTER — Ambulatory Visit: Payer: Medicare Other | Admitting: Family Medicine

## 2023-03-15 ENCOUNTER — Ambulatory Visit: Payer: Self-pay | Admitting: *Deleted

## 2023-03-15 VITALS — BP 134/89 | HR 93 | Temp 99.0°F | Ht 64.57 in | Wt 171.8 lb

## 2023-03-15 DIAGNOSIS — W5501XA Bitten by cat, initial encounter: Secondary | ICD-10-CM | POA: Diagnosis not present

## 2023-03-15 DIAGNOSIS — L0390 Cellulitis, unspecified: Secondary | ICD-10-CM | POA: Insufficient documentation

## 2023-03-15 DIAGNOSIS — L03114 Cellulitis of left upper limb: Secondary | ICD-10-CM

## 2023-03-15 MED ORDER — AMOXICILLIN-POT CLAVULANATE 875-125 MG PO TABS: 1.0000 | ORAL_TABLET | Freq: Two times a day (BID) | ORAL | 0 refills | Status: DC

## 2023-03-15 MED ORDER — TRIAMCINOLONE ACETONIDE 40 MG/ML IJ SUSP
40.0000 mg | Freq: Once | INTRAMUSCULAR | Status: AC
Start: 2023-03-15 — End: 2023-03-15
  Administered 2023-03-15: 40 mg via INTRAMUSCULAR

## 2023-03-15 MED ORDER — PREDNISONE 10 MG PO TABS: ORAL_TABLET | ORAL | 0 refills | Status: DC

## 2023-03-15 MED ORDER — AMOXICILLIN-POT CLAVULANATE 875-125 MG PO TABS: 1.0000 | ORAL_TABLET | Freq: Two times a day (BID) | ORAL | 0 refills | Status: AC

## 2023-03-15 NOTE — Progress Notes (Signed)
BP 134/89   Pulse 93   Temp 99 F (37.2 C) (Oral)   Ht 5' 4.57" (1.64 m)   Wt 171 lb 12.8 oz (77.9 kg)   SpO2 98%   BMI 28.97 kg/m    Subjective:    Patient ID: Pamela Barrett, female    DOB: 1956/05/13, 67 y.o.   MRN: [id number]  HPI: Pamela Barrett is a 67 y.o. female  Chief Complaint  Patient presents with   Animal Bite   CAT BITE Bitten by cat that she owns, admits the cat is up to date and fully vaccinated, no concerns of exposure to rabies.  Duration: yesterday Location: Left forearm History of trauma in area: no Pain: Yes Quality: yes Severity: 4/10 Redness: yes Swelling: yes Oozing: no Pus: no Fevers: yes Nausea/vomiting: no Status: worse Treatments attempted: Washed the area and applied neosporin  with Advil Tetanus: UTD   Relevant past medical, surgical, family and social history reviewed and updated as indicated. Interim medical history since our last visit reviewed. Allergies and medications reviewed and updated.  Review of Systems  Constitutional:  Positive for fever. Negative for chills.  Respiratory: Negative.    Cardiovascular: Negative.   Gastrointestinal:  Negative for nausea and vomiting.  Musculoskeletal:  Negative for arthralgias.  Skin:  Positive for wound.    Per HPI unless specifically indicated above     Objective:    BP 134/89   Pulse 93   Temp 99 F (37.2 C) (Oral)   Ht 5' 4.57" (1.64 m)   Wt 171 lb 12.8 oz (77.9 kg)   SpO2 98%   BMI 28.97 kg/m   Wt Readings from Last 3 Encounters:  03/15/23 171 lb 12.8 oz (77.9 kg)  02/21/23 170 lb 6.4 oz (77.3 kg)  01/24/23 166 lb (75.3 kg)    Physical Exam Vitals and nursing note reviewed.  Constitutional:      General: She is awake. She is not in acute distress.    Appearance: Normal appearance. She is well-developed and well-groomed. She is not ill-appearing.  HENT:     Head: Normocephalic and atraumatic.     Right Ear: Hearing and external ear normal. No drainage.      Left Ear: Hearing and external ear normal. No drainage.     Nose: Nose normal.  Eyes:     General: Lids are normal.        Right eye: No discharge.        Left eye: No discharge.     Conjunctiva/sclera: Conjunctivae normal.  Cardiovascular:     Rate and Rhythm: Normal rate and regular rhythm.     Pulses:          Radial pulses are 2+ on the right side and 2+ on the left side.       Posterior tibial pulses are 2+ on the right side and 2+ on the left side.     Heart sounds: Normal heart sounds, S1 normal and S2 normal. No murmur heard.    No gallop.  Pulmonary:     Effort: Pulmonary effort is normal. No accessory muscle usage or respiratory distress.     Breath sounds: Normal breath sounds. No wheezing, rhonchi or rales.  Musculoskeletal:        General: Normal range of motion.     Cervical back: Full passive range of motion without pain and normal range of motion.     Right lower leg: No edema.  Left lower leg: No edema.  Skin:    General: Skin is warm and dry.     Capillary Refill: Capillary refill takes less than 2 seconds.     Findings: Erythema, rash and wound present. Rash is crusting.       Neurological:     Mental Status: She is alert and oriented to person, place, and time.  Psychiatric:        Attention and Perception: Attention normal.        Mood and Affect: Mood normal.        Speech: Speech normal.        Behavior: Behavior normal. Behavior is cooperative.        Thought Content: Thought content normal.     Results for orders placed or performed in visit on 08/20/22  CBC with Differential/Platelet  Result Value Ref Range   WBC 5.0 3.4 - 10.8 x10E3/uL   RBC 4.79 3.77 - 5.28 x10E6/uL   Hemoglobin 13.9 11.1 - 15.9 g/dL   Hematocrit 45.4 09.8 - 46.6 %   MCV 88 79 - 97 fL   MCH 29.0 26.6 - 33.0 pg   MCHC 33.2 31.5 - 35.7 g/dL   RDW 11.9 14.7 - 82.9 %   Platelets 202 150 - 450 x10E3/uL   Neutrophils 71 Not Estab. %   Lymphs 21 Not Estab. %    Monocytes 6 Not Estab. %   Eos 1 Not Estab. %   Basos 1 Not Estab. %   Neutrophils Absolute 3.5 1.4 - 7.0 x10E3/uL   Lymphocytes Absolute 1.1 0.7 - 3.1 x10E3/uL   Monocytes Absolute 0.3 0.1 - 0.9 x10E3/uL   EOS (ABSOLUTE) 0.1 0.0 - 0.4 x10E3/uL   Basophils Absolute 0.0 0.0 - 0.2 x10E3/uL   Immature Granulocytes 0 Not Estab. %   Immature Grans (Abs) 0.0 0.0 - 0.1 x10E3/uL  Comprehensive metabolic panel  Result Value Ref Range   Glucose 98 70 - 99 mg/dL   BUN 12 8 - 27 mg/dL   Creatinine, Ser 5.62 0.57 - 1.00 mg/dL   eGFR 88 >13 YQ/MVH/8.46   BUN/Creatinine Ratio 16 12 - 28   Sodium 140 134 - 144 mmol/L   Potassium 4.1 3.5 - 5.2 mmol/L   Chloride 104 96 - 106 mmol/L   CO2 22 20 - 29 mmol/L   Calcium 9.1 8.7 - 10.3 mg/dL   Total Protein 6.4 6.0 - 8.5 g/dL   Albumin 4.4 3.9 - 4.9 g/dL   Globulin, Total 2.0 1.5 - 4.5 g/dL   Albumin/Globulin Ratio 2.2 1.2 - 2.2   Bilirubin Total 0.3 0.0 - 1.2 mg/dL   Alkaline Phosphatase 82 44 - 121 IU/L   AST 16 0 - 40 IU/L   ALT 12 0 - 32 IU/L  Lipid Panel w/o Chol/HDL Ratio  Result Value Ref Range   Cholesterol, Total 228 (H) 100 - 199 mg/dL   Triglycerides 97 0 - 149 mg/dL   HDL 71 >96 mg/dL   VLDL Cholesterol Cal 17 5 - 40 mg/dL   LDL Chol Calc (NIH) 295 (H) 0 - 99 mg/dL  TSH  Result Value Ref Range   TSH 0.756 0.450 - 4.500 uIU/mL  VITAMIN D 25 Hydroxy (Vit-D Deficiency, Fractures)  Result Value Ref Range   Vit D, 25-Hydroxy 38.4 30.0 - 100.0 ng/mL      Assessment & Plan:   Problem List Items Addressed This Visit     Cat bite   Cellulitis - Primary  Acute, stable. Left lower forearm. Will treat with Augmentin BID for 7 days, 6 day Prednisone taper given, and Kenalog 40 MG IM given in office today. Recommend keeping area clean, dry, and covered while sleeping. Provided wound care supplies and redressed bandage in office today. Informed to let office know if no improvement 48 hours after taking antibiotics. Return in 1 week, call  sooner if concerns arise.        Follow up plan: Return in about 1 week (around 03/22/2023) for Follow up cat bite.

## 2023-03-15 NOTE — Telephone Encounter (Signed)
Reason for Disposition  [1] Puncture wound (hole through the skin) AND [2] from a cat bite (or deep claw puncture wound)  Answer Assessment - Initial Assessment Questions 1. ANIMAL: "What type of animal caused the bite?" "Is the injury from a bite or a claw?" If the animal is a dog or a cat, ask: "Was it a pet or a stray?" "Was it acting ill or behaving strangely?"     Cat- has seizers and bit her, pet, no 2. LOCATION: "Where is the bite located?"      Puncture- left arm above wrist/hand 3. SIZE: "How big is the bite?" "What does it look like?"      Whole mouth print- multiple, 1 inch 4. ONSET: "When did the bite happen?" (Minutes or hours ago)      yesterday 5. CIRCUMSTANCES: "Tell me how this happened."      Cat had a seizure while sitting on arm of chair- patient got bitten 6. TETANUS: "When was your last tetanus booster?"     Yes- just had 4-5 years 7. RABIES VACCINE: For dog or cat bites, ask: "Do you know if the pet is vaccinated against rabies?"  (e.g., yes, no, overdue for rabies shot, unknown)     Vaccines are up to date  Protocols used: Animal Bite-A-AH

## 2023-03-15 NOTE — Assessment & Plan Note (Deleted)
Acute, stable. Wound appears infected. Will treat with Augmentin BID for 7 days, 6 day Prednisone taper given, and Kenalog 40 MG IM given in office today. Recommend keep area clean, dry, and covered while sleeping. Informed to let office know if no improvement in next 48 hours. Return in 1 week, call sooner if concerns arise.

## 2023-03-15 NOTE — Telephone Encounter (Signed)
  Chief Complaint: cat bite- pet Symptoms: multiple puncture wounds- left wrist hand Frequency: yesterday Pertinent Negatives: Patient denies fever Disposition: [] ED /[] Urgent Care (no appt availability in office) / [x] Appointment(In office/virtual)/ []  [place] [facility]/ [] Home Care/ [] Refused Recommended Disposition /[] [place] [facility]/ []  Follow-up with PCP Additional Notes: Patient was sitting next to pet cat- cat has seizure hx and will grab onto anything close when this happens- got patient's hand. Puncture and swelling to left hand.

## 2023-03-15 NOTE — Assessment & Plan Note (Addendum)
Acute, stable. Left lower forearm. Will treat with Augmentin BID for 7 days, 6 day Prednisone taper given, and Kenalog 40 MG IM given in office today. Recommend keeping area clean, dry, and covered while sleeping. Provided wound care supplies and redressed bandage in office today. Informed to let office know if no improvement 48 hours after taking antibiotics. Return in 1 week, call sooner if concerns arise.

## 2023-03-15 NOTE — Patient Instructions (Addendum)
If no improvement in wound 48 hours after taking the antibiotics let us know.  Start antibiotics this evening Start Prednisone tomorrow

## 2023-03-21 ENCOUNTER — Ambulatory Visit: Payer: Medicare Other | Admitting: Family Medicine

## 2023-03-21 VITALS — BP 140/92 | Temp 97.9°F | Ht 65.55 in | Wt 171.6 lb

## 2023-03-21 DIAGNOSIS — R030 Elevated blood-pressure reading, without diagnosis of hypertension: Secondary | ICD-10-CM

## 2023-03-21 DIAGNOSIS — W5501XD Bitten by cat, subsequent encounter: Secondary | ICD-10-CM | POA: Diagnosis not present

## 2023-03-21 DIAGNOSIS — I10 Essential (primary) hypertension: Secondary | ICD-10-CM | POA: Insufficient documentation

## 2023-03-21 NOTE — Progress Notes (Signed)
BP (!) 140/92   Temp 97.9 F (36.6 C) (Oral)   Ht 5' 5.55" (1.665 m)   Wt 171 lb 9.6 oz (77.8 kg)   BMI 28.08 kg/m    Subjective:    Patient ID: Pamela Barrett, female    DOB: 1956-02-27, 67 y.o.   MRN: [id number]  HPI: Pamela Barrett is a 67 y.o. female  Chief Complaint  Patient presents with   Animal Bite    Follow up for cat bite after completion of antibiotics.    CAT BITE She is here today for follow up for cat bite that occurred a week ago by her owned and fully vaccinated cat. She has completed 6/7 day course of Augmentin BID and completed 6 day Prednisone taper this morning. She states the steroids made her feel jittery and  anxious. She admits the Prednisone and antibiotic helped with the swelling and used Advil for pain relief. She admits to feeling better, denies pain, drainage, erythema, and edema.   BP elevated in office today. She denies dizziness, light headedness, visual disturbances, or headache. Agreeable to start checking BP at home.   Relevant past medical, surgical, family and social history reviewed and updated as indicated. Interim medical history since our last visit reviewed. Allergies and medications reviewed and updated.  Review of Systems  Constitutional:  Negative for chills and fever.  Eyes:  Negative for visual disturbance.  Respiratory: Negative.    Cardiovascular: Negative.   Gastrointestinal:  Negative for nausea and vomiting.  Musculoskeletal:  Negative for arthralgias.  Skin:  Positive for wound.  Neurological:  Negative for dizziness, syncope, light-headedness and headaches.    Per HPI unless specifically indicated above     Objective:    BP (!) 140/92   Temp 97.9 F (36.6 C) (Oral)   Ht 5' 5.55" (1.665 m)   Wt 171 lb 9.6 oz (77.8 kg)   BMI 28.08 kg/m   Wt Readings from Last 3 Encounters:  03/21/23 171 lb 9.6 oz (77.8 kg)  03/15/23 171 lb 12.8 oz (77.9 kg)  02/21/23 170 lb 6.4 oz (77.3 kg)    Physical Exam Vitals  and nursing note reviewed.  Constitutional:      General: She is awake. She is not in acute distress.    Appearance: Normal appearance. She is well-developed and well-groomed. She is not ill-appearing.  HENT:     Head: Normocephalic and atraumatic.     Right Ear: Hearing and external ear normal. No drainage.     Left Ear: Hearing and external ear normal. No drainage.     Nose: Nose normal.  Eyes:     General: Lids are normal.        Right eye: No discharge.        Left eye: No discharge.     Conjunctiva/sclera: Conjunctivae normal.  Cardiovascular:     Rate and Rhythm: Normal rate and regular rhythm.     Pulses:          Radial pulses are 2+ on the right side and 2+ on the left side.       Posterior tibial pulses are 2+ on the right side and 2+ on the left side.     Heart sounds: Normal heart sounds, S1 normal and S2 normal. No murmur heard.    No gallop.  Pulmonary:     Effort: Pulmonary effort is normal. No accessory muscle usage or respiratory distress.     Breath sounds: Normal breath sounds.  No wheezing, rhonchi or rales.  Musculoskeletal:        General: Normal range of motion.     Right wrist: Normal. Normal range of motion.     Left wrist: Normal. Normal range of motion.     Cervical back: Full passive range of motion without pain and normal range of motion.     Right lower leg: No edema.     Left lower leg: No edema.  Skin:    General: Skin is warm and dry.     Capillary Refill: Capillary refill takes less than 2 seconds.     Findings: No erythema, rash or wound.       Neurological:     Mental Status: She is alert and oriented to person, place, and time.  Psychiatric:        Attention and Perception: Attention normal.        Mood and Affect: Mood normal.        Speech: Speech normal.        Behavior: Behavior normal. Behavior is cooperative.        Thought Content: Thought content normal.     Results for orders placed or performed in visit on 08/20/22  CBC  with Differential/Platelet  Result Value Ref Range   WBC 5.0 3.4 - 10.8 x10E3/uL   RBC 4.79 3.77 - 5.28 x10E6/uL   Hemoglobin 13.9 11.1 - 15.9 g/dL   Hematocrit 51.8 84.1 - 46.6 %   MCV 88 79 - 97 fL   MCH 29.0 26.6 - 33.0 pg   MCHC 33.2 31.5 - 35.7 g/dL   RDW 66.0 63.0 - 16.0 %   Platelets 202 150 - 450 x10E3/uL   Neutrophils 71 Not Estab. %   Lymphs 21 Not Estab. %   Monocytes 6 Not Estab. %   Eos 1 Not Estab. %   Basos 1 Not Estab. %   Neutrophils Absolute 3.5 1.4 - 7.0 x10E3/uL   Lymphocytes Absolute 1.1 0.7 - 3.1 x10E3/uL   Monocytes Absolute 0.3 0.1 - 0.9 x10E3/uL   EOS (ABSOLUTE) 0.1 0.0 - 0.4 x10E3/uL   Basophils Absolute 0.0 0.0 - 0.2 x10E3/uL   Immature Granulocytes 0 Not Estab. %   Immature Grans (Abs) 0.0 0.0 - 0.1 x10E3/uL  Comprehensive metabolic panel  Result Value Ref Range   Glucose 98 70 - 99 mg/dL   BUN 12 8 - 27 mg/dL   Creatinine, Ser 1.09 0.57 - 1.00 mg/dL   eGFR 88 >32 TF/TDD/2.20   BUN/Creatinine Ratio 16 12 - 28   Sodium 140 134 - 144 mmol/L   Potassium 4.1 3.5 - 5.2 mmol/L   Chloride 104 96 - 106 mmol/L   CO2 22 20 - 29 mmol/L   Calcium 9.1 8.7 - 10.3 mg/dL   Total Protein 6.4 6.0 - 8.5 g/dL   Albumin 4.4 3.9 - 4.9 g/dL   Globulin, Total 2.0 1.5 - 4.5 g/dL   Albumin/Globulin Ratio 2.2 1.2 - 2.2   Bilirubin Total 0.3 0.0 - 1.2 mg/dL   Alkaline Phosphatase 82 44 - 121 IU/L   AST 16 0 - 40 IU/L   ALT 12 0 - 32 IU/L  Lipid Panel w/o Chol/HDL Ratio  Result Value Ref Range   Cholesterol, Total 228 (H) 100 - 199 mg/dL   Triglycerides 97 0 - 149 mg/dL   HDL 71 >25 mg/dL   VLDL Cholesterol Cal 17 5 - 40 mg/dL   LDL Chol Calc (NIH) 427 (H) 0 -  99 mg/dL  TSH  Result Value Ref Range   TSH 0.756 0.450 - 4.500 uIU/mL  VITAMIN D 25 Hydroxy (Vit-D Deficiency, Fractures)  Result Value Ref Range   Vit D, 25-Hydroxy 38.4 30.0 - 100.0 ng/mL      Assessment & Plan:   Problem List Items Addressed This Visit     Cat bite - Primary    Acute, improved.  Wound has improved significantly, no redness or edema present. Will complete 7 day course of Augmentin tomorrow and completed 6 day Prednisone taper today. Recommend continued use of Advil for discomfort. Follow up as needed, call sooner if concerns arise.       Elevated blood pressure reading    Acute, stable. In office BP 140/92, encouraged patient to start checking BP and bring readings at next follow up visit, she is agreeable to this. Provided education on DASH diet and decreasing sodium intake to maintain BP control. Instructed to notify office if readings above 140/90 occur frequently.          Follow up plan: Return if symptoms worsen or fail to improve.

## 2023-03-21 NOTE — Assessment & Plan Note (Addendum)
Acute, stable. In office BP 140/92, encouraged patient to start checking BP and bring readings at next follow up visit, she is agreeable to this. Provided education on DASH diet and decreasing sodium intake to maintain BP control. Instructed to notify office if readings above 140/90 occur frequently.

## 2023-03-21 NOTE — Patient Instructions (Addendum)
Start checking BP at home. Write readings down and bring log at next visit.   BP should be less than 140 over 90.   If BP is higher than 140 over 90 frequently, schedule visit sooner.   Exercise 150 minutes weekly or 30 minutes daily for 5 days.

## 2023-03-21 NOTE — Assessment & Plan Note (Signed)
Acute, improved. Wound has improved significantly, no redness or edema present. Will complete 7 day course of Augmentin tomorrow and completed 6 day Prednisone taper today. Recommend continued use of Advil for discomfort. Follow up as needed, call sooner if concerns arise.

## 2023-05-02 ENCOUNTER — Ambulatory Visit (INDEPENDENT_AMBULATORY_CARE_PROVIDER_SITE_OTHER): Payer: Medicare Other | Admitting: Nurse Practitioner

## 2023-05-02 ENCOUNTER — Telehealth: Payer: Self-pay | Admitting: Nurse Practitioner

## 2023-05-02 ENCOUNTER — Encounter: Payer: Self-pay | Admitting: Nurse Practitioner

## 2023-05-02 ENCOUNTER — Ambulatory Visit: Payer: Self-pay | Admitting: *Deleted

## 2023-05-02 VITALS — BP 132/88 | HR 76 | Temp 98.1°F | Ht 65.5 in | Wt 171.6 lb

## 2023-05-02 DIAGNOSIS — R42 Dizziness and giddiness: Secondary | ICD-10-CM

## 2023-05-02 DIAGNOSIS — I10 Essential (primary) hypertension: Secondary | ICD-10-CM

## 2023-05-02 DIAGNOSIS — R21 Rash and other nonspecific skin eruption: Secondary | ICD-10-CM | POA: Diagnosis not present

## 2023-05-02 MED ORDER — OLMESARTAN MEDOXOMIL 5 MG PO TABS: 5.0000 mg | ORAL_TABLET | Freq: Every day | ORAL | 4 refills | Status: DC

## 2023-05-02 MED ORDER — VALACYCLOVIR HCL 1 G PO TABS: 1000.0000 mg | ORAL_TABLET | Freq: Three times a day (TID) | ORAL | 0 refills | Status: AC

## 2023-05-02 NOTE — Assessment & Plan Note (Signed)
Acute for 24 hours, vesicular in appearance under right breast to right back.  Suspect shingles, will start Valtrex 1000 MG TID for 10 days.  Script sent in.  Educated her on this disease process and medication used.  She will place hydrocortisone cream too rash is becomes too itchy.

## 2023-05-02 NOTE — Telephone Encounter (Signed)
Copied from CRM [id number]. Topic: General - Inquiry >> May 02, 2023  1:25 PM Albin Felling L wrote: Reason for CRM: Pt scheduled for endoscopy on Monday, 05-09-2023. Pt had appt today with Aura Dials, NP & dx with shingles. Pt wanting to know if she needs to cx endoscopy procedure due to shingles. Pt unsure what time it would be scheduled for and will not know until Wednesday.  Pt requesting provider's advice.

## 2023-05-02 NOTE — Telephone Encounter (Signed)
Called and notified patient of Jolene's message. Patient verbalized understanding.

## 2023-05-02 NOTE — Patient Instructions (Signed)

## 2023-05-02 NOTE — Assessment & Plan Note (Signed)
Ongoing on and off since taking Prednisone, suspect related as does not tolerate steroids.  Will monitor closely.  Overall reassuring neuro exam and no red flags.  Refer to BP plan of care for changes there.

## 2023-05-02 NOTE — Telephone Encounter (Signed)
  Chief Complaint: Dizziness Symptoms: Lightheadedness, heart racing at times, BP trending up. Dizziness does not occur daily., Brief episodes Frequency: "Since Oct when placed on steroids." Pertinent Negatives: Patient denies headache Disposition: [] ED /[] Urgent Care (no appt availability in office) / [x] Appointment(In office/virtual)/ []  [facility]/ [] Home Care/ [] Refused Recommended Disposition /[]  Mobile Bus/ []  Follow-up with PCP Additional Notes:  Appt secured for today. Care advise provided, pt verbalizes understanding.  Reason for Disposition  [1] MODERATE dizziness (e.g., interferes with normal activities) AND [2] has NOT been evaluated by doctor (or NP/PA) for this  (Exception: Dizziness caused by heat exposure, sudden standing, or poor fluid intake.)  Answer Assessment - Initial Assessment Questions 1. DESCRIPTION: "Describe your dizziness."     "Floating" 2. LIGHTHEADED: "Do you feel lightheaded?" (e.g., somewhat faint, woozy, weak upon standing)     Yes 3. VERTIGO: "Do you feel like either you or the room is spinning or tilting?" (i.e. vertigo)     no 4. SEVERITY: "How bad is it?"  "Do you feel like you are going to faint?" "Can you stand and walk?"   - MILD: Feels slightly dizzy, but walking normally.   - MODERATE: Feels unsteady when walking, but not falling; interferes with normal activities (e.g., school, work).   - SEVERE: Unable to walk without falling, or requires assistance to walk without falling; feels like passing out now.      Mild 5. ONSET:  "When did the dizziness begin?"     After cellulitis. 6. AGGRAVATING FACTORS: "Does anything make it worse?" (e.g., standing, change in head position)     *No Answer* 7. HEART RATE: "Can you tell me your heart rate?" "How many beats in 15 seconds?"  (Note: not all patients can do this)       Heart races after episodes 8. CAUSE: "What do you think is causing the dizziness?"     *No Answer* 9.  RECURRENT SYMPTOM: "Have you had dizziness before?" If Yes, ask: "When was the last time?" "What happened that time?"     *No Answer* 10. OTHER SYMPTOMS: "Do you have any other symptoms?" (e.g., fever, chest pain, vomiting, diarrhea, bleeding)       BP trending higher  Protocols used: Dizziness - Lightheadedness-A-AH

## 2023-05-02 NOTE — Progress Notes (Signed)
BP 132/88 (BP Location: Left Arm, Patient Position: Sitting, Cuff Size: Normal)   Pulse 76   Temp 98.1 F (36.7 C) (Oral)   Ht 5' 5.5" (1.664 m)   Wt 171 lb 9.6 oz (77.8 kg)   SpO2 98%   BMI 28.12 kg/m    Subjective:    Patient ID: Pamela Barrett, female    DOB: [birth year], 67 y.o.   MRN: [id number]  HPI: Pamela Barrett is a 67 y.o. female  Chief Complaint  Patient presents with   Hypertension    Patient states she has been getting some high BP readings at home recently. States she was put on Prednisone last month for cellulitis and ever since, she she has been having problems with her BP going up as well as off and on dizziness.    DIZZINESS Was put on Prednisone last month for cellulitis, 5 days. Has been getting some high BP levels at home = 140-160/80, then will take it again and will be normal.  Now has a rash that showed up two days ago to right side and back. Currently does not feel right, feels floaty feeling in the head and will have nervous spells.    Did take BP medication in past -- about 15 years ago, does not recall what she take. Duration: since October after treatment Description of symptoms: ill-defined -- does not stable, head does not feel right Duration of episode: minutes 5 minutes and gone Dizziness frequency: no history of the same Provoking factors: none Aggravating factors:  none Triggered by rolling over in bed: no Triggered by bending over: no Aggravated by head movement: no Aggravated by exertion, coughing, loud noises: no Recent head injury: no Recent or current viral symptoms: no History of vasovagal episodes: no Nausea: no Vomiting: no Tinnitus: no Hearing loss: no Aural fullness: no Headache: no Photophobia/phonophobia: no Unsteady gait: no Postural instability: no Diplopia, dysarthria, dysphagia or weakness: no Related to exertion: no Pallor: no Diaphoresis: no Dyspnea: no Chest pain: no   Relevant past medical,  surgical, family and social history reviewed and updated as indicated. Interim medical history since our last visit reviewed. Allergies and medications reviewed and updated.  Review of Systems  Constitutional:  Positive for fatigue. Negative for activity change, appetite change, diaphoresis and fever.  Respiratory:  Negative for cough, chest tightness, shortness of breath and wheezing.   Cardiovascular:  Negative for chest pain, palpitations and leg swelling.  Gastrointestinal: Negative.   Skin:  Positive for rash.  Neurological:  Positive for dizziness. Negative for syncope, facial asymmetry, speech difficulty, weakness, light-headedness, numbness and headaches.  Psychiatric/Behavioral: Negative.      Per HPI unless specifically indicated above     Objective:    BP 132/88 (BP Location: Left Arm, Patient Position: Sitting, Cuff Size: Normal)   Pulse 76   Temp 98.1 F (36.7 C) (Oral)   Ht 5' 5.5" (1.664 m)   Wt 171 lb 9.6 oz (77.8 kg)   SpO2 98%   BMI 28.12 kg/m   Wt Readings from Last 3 Encounters:  05/02/23 171 lb 9.6 oz (77.8 kg)  03/21/23 171 lb 9.6 oz (77.8 kg)  03/15/23 171 lb 12.8 oz (77.9 kg)    Physical Exam Vitals and nursing note reviewed.  Constitutional:      General: She is awake. She is not in acute distress.    Appearance: Normal appearance. She is well-developed and well-groomed. She is not ill-appearing or toxic-appearing.  HENT:  Head: Normocephalic.     Right Ear: Hearing and external ear normal.     Left Ear: Hearing and external ear normal.  Eyes:     General: Lids are normal.        Right eye: No discharge.        Left eye: No discharge.     Extraocular Movements: Extraocular movements intact.     Conjunctiva/sclera: Conjunctivae normal.     Pupils: Pupils are equal, round, and reactive to light.  Neck:     Thyroid: No thyromegaly.     Vascular: No carotid bruit.  Cardiovascular:     Rate and Rhythm: Normal rate and regular rhythm.      Heart sounds: Normal heart sounds. No murmur heard.    No gallop.  Pulmonary:     Effort: Pulmonary effort is normal. No accessory muscle usage or respiratory distress.     Breath sounds: Normal breath sounds.  Abdominal:     General: Bowel sounds are normal. There is no distension.     Palpations: Abdomen is soft.     Tenderness: There is no abdominal tenderness.  Musculoskeletal:     Cervical back: Normal range of motion and neck supple.     Right lower leg: No edema.     Left lower leg: No edema.  Lymphadenopathy:     Cervical: No cervical adenopathy.  Skin:    General: Skin is warm and dry.     Findings: Rash present. Rash is vesicular.     Comments: Vesicular in appearance rash under right breast and extending to back.  Cluster patterns present.  Neurological:     Mental Status: She is alert and oriented to person, place, and time.     Cranial Nerves: Cranial nerves 2-12 are intact.     Motor: Motor function is intact.     Coordination: Coordination is intact.     Gait: Gait is intact.     Deep Tendon Reflexes: Reflexes are normal and symmetric.     Reflex Scores:      Brachioradialis reflexes are 2+ on the right side and 2+ on the left side.      Patellar reflexes are 2+ on the right side and 2+ on the left side. Psychiatric:        Attention and Perception: Attention normal.        Mood and Affect: Mood normal.        Speech: Speech normal.        Behavior: Behavior normal. Behavior is cooperative.        Thought Content: Thought content normal.     Results for orders placed or performed in visit on 08/20/22  CBC with Differential/Platelet  Result Value Ref Range   WBC 5.0 3.4 - 10.8 x10E3/uL   RBC 4.79 3.77 - 5.28 x10E6/uL   Hemoglobin 13.9 11.1 - 15.9 g/dL   Hematocrit 78.2 95.6 - 46.6 %   MCV 88 79 - 97 fL   MCH 29.0 26.6 - 33.0 pg   MCHC 33.2 31.5 - 35.7 g/dL   RDW 21.3 08.6 - 57.8 %   Platelets 202 150 - 450 x10E3/uL   Neutrophils 71 Not Estab. %    Lymphs 21 Not Estab. %   Monocytes 6 Not Estab. %   Eos 1 Not Estab. %   Basos 1 Not Estab. %   Neutrophils Absolute 3.5 1.4 - 7.0 x10E3/uL   Lymphocytes Absolute 1.1 0.7 - 3.1 x10E3/uL   Monocytes  Absolute 0.3 0.1 - 0.9 x10E3/uL   EOS (ABSOLUTE) 0.1 0.0 - 0.4 x10E3/uL   Basophils Absolute 0.0 0.0 - 0.2 x10E3/uL   Immature Granulocytes 0 Not Estab. %   Immature Grans (Abs) 0.0 0.0 - 0.1 x10E3/uL  Comprehensive metabolic panel  Result Value Ref Range   Glucose 98 70 - 99 mg/dL   BUN 12 8 - 27 mg/dL   Creatinine, Ser 1.61 0.57 - 1.00 mg/dL   eGFR 88 >09 UE/AVW/0.98   BUN/Creatinine Ratio 16 12 - 28   Sodium 140 134 - 144 mmol/L   Potassium 4.1 3.5 - 5.2 mmol/L   Chloride 104 96 - 106 mmol/L   CO2 22 20 - 29 mmol/L   Calcium 9.1 8.7 - 10.3 mg/dL   Total Protein 6.4 6.0 - 8.5 g/dL   Albumin 4.4 3.9 - 4.9 g/dL   Globulin, Total 2.0 1.5 - 4.5 g/dL   Albumin/Globulin Ratio 2.2 1.2 - 2.2   Bilirubin Total 0.3 0.0 - 1.2 mg/dL   Alkaline Phosphatase 82 44 - 121 IU/L   AST 16 0 - 40 IU/L   ALT 12 0 - 32 IU/L  Lipid Panel w/o Chol/HDL Ratio  Result Value Ref Range   Cholesterol, Total 228 (H) 100 - 199 mg/dL   Triglycerides 97 0 - 149 mg/dL   HDL 71 >11 mg/dL   VLDL Cholesterol Cal 17 5 - 40 mg/dL   LDL Chol Calc (NIH) 914 (H) 0 - 99 mg/dL  TSH  Result Value Ref Range   TSH 0.756 0.450 - 4.500 uIU/mL  VITAMIN D 25 Hydroxy (Vit-D Deficiency, Fractures)  Result Value Ref Range   Vit D, 25-Hydroxy 38.4 30.0 - 100.0 ng/mL      Assessment & Plan:   Problem List Items Addressed This Visit       Cardiovascular and Mediastinum   Essential hypertension - Primary    Ongoing, was stable for years without but recently levels continue to be up, even weeks after being off Prednisone.  Took medication in past.  Will start Olmesartan 5 MG daily, educated her on this medication and use + side effects to monitor for.  Recommend she monitor BP at least a few mornings a week at home and  document.  DASH diet at home.  Labs today: up to date.  Return in 2 weeks.       Relevant Medications   olmesartan (BENICAR) 5 MG tablet     Musculoskeletal and Integument   Rash    Acute for 24 hours, vesicular in appearance under right breast to right back.  Suspect shingles, will start Valtrex 1000 MG TID for 10 days.  Script sent in.  Educated her on this disease process and medication used.  She will place hydrocortisone cream too rash is becomes too itchy.        Other   Dizziness    Ongoing on and off since taking Prednisone, suspect related as does not tolerate steroids.  Will monitor closely.  Overall reassuring neuro exam and no red flags.  Refer to BP plan of care for changes there.         Follow up plan: Return in about 2 weeks (around 05/16/2023) for Shingles and BP check -- added Olmesartan.

## 2023-05-02 NOTE — Assessment & Plan Note (Signed)
Ongoing, was stable for years without but recently levels continue to be up, even weeks after being off Prednisone.  Took medication in past.  Will start Olmesartan 5 MG daily, educated her on this medication and use + side effects to monitor for.  Recommend she monitor BP at least a few mornings a week at home and document.  DASH diet at home.  Labs today: up to date.  Return in 2 weeks.

## 2023-05-09 ENCOUNTER — Ambulatory Visit
Admission: RE | Admit: 2023-05-09 | Discharge: 2023-05-09 | Disposition: A | Discharge status: Home / Self Care | Payer: Medicare Other | Attending: Gastroenterology | Admitting: Gastroenterology

## 2023-05-09 ENCOUNTER — Ambulatory Visit: Payer: Medicare Other | Admitting: Anesthesiology

## 2023-05-09 ENCOUNTER — Encounter: Payer: Self-pay | Admitting: *Deleted

## 2023-05-09 ENCOUNTER — Encounter
Admission: RE | Disposition: A | Discharge status: Home / Self Care | Payer: Self-pay | Source: Home / Self Care | Attending: Gastroenterology

## 2023-05-09 DIAGNOSIS — K209 Esophagitis, unspecified without bleeding: Secondary | ICD-10-CM | POA: Diagnosis not present

## 2023-05-09 DIAGNOSIS — K2100 Gastro-esophageal reflux disease with esophagitis, without bleeding: Secondary | ICD-10-CM | POA: Diagnosis not present

## 2023-05-09 DIAGNOSIS — K449 Diaphragmatic hernia without obstruction or gangrene: Secondary | ICD-10-CM | POA: Diagnosis not present

## 2023-05-09 DIAGNOSIS — I10 Essential (primary) hypertension: Secondary | ICD-10-CM | POA: Diagnosis not present

## 2023-05-09 DIAGNOSIS — Z800 Family history of malignant neoplasm of digestive organs: Secondary | ICD-10-CM | POA: Insufficient documentation

## 2023-05-09 DIAGNOSIS — Z87891 Personal history of nicotine dependence: Secondary | ICD-10-CM | POA: Insufficient documentation

## 2023-05-09 DIAGNOSIS — Z09 Encounter for follow-up examination after completed treatment for conditions other than malignant neoplasm: Secondary | ICD-10-CM | POA: Insufficient documentation

## 2023-05-09 HISTORY — PX: ESOPHAGOGASTRODUODENOSCOPY (EGD) WITH PROPOFOL: SHX5813

## 2023-05-09 HISTORY — DX: Essential (primary) hypertension: I10

## 2023-05-09 SURGERY — ESOPHAGOGASTRODUODENOSCOPY (EGD) WITH PROPOFOL
Anesthesia: General

## 2023-05-09 MED ORDER — LIDOCAINE HCL (PF) 2 % IJ SOLN
INTRAMUSCULAR | Status: AC
  Filled 2023-05-09: qty 5

## 2023-05-09 MED ORDER — SODIUM CHLORIDE 0.9 % IV SOLN: INTRAVENOUS | Status: DC

## 2023-05-09 MED ORDER — LIDOCAINE HCL (PF) 2 % IJ SOLN
INTRAMUSCULAR | Status: DC | PRN
  Administered 2023-05-09: 100 mg via INTRADERMAL

## 2023-05-09 MED ORDER — PROPOFOL 1000 MG/100ML IV EMUL
INTRAVENOUS | Status: AC
  Filled 2023-05-09: qty 100

## 2023-05-09 MED ORDER — PHENYLEPHRINE 80 MCG/ML (10ML) SYRINGE FOR IV PUSH (FOR BLOOD PRESSURE SUPPORT)
PREFILLED_SYRINGE | INTRAVENOUS | Status: AC
  Filled 2023-05-09: qty 10

## 2023-05-09 MED ORDER — PROPOFOL 500 MG/50ML IV EMUL
INTRAVENOUS | Status: DC | PRN
  Administered 2023-05-09: 100 ug/kg/min via INTRAVENOUS

## 2023-05-09 NOTE — Anesthesia Preprocedure Evaluation (Addendum)
Anesthesia Evaluation  Patient identified by MRN, date of birth, ID band Patient awake    Reviewed: Allergy & Precautions, NPO status , Patient's Chart, lab work & pertinent test results  History of Anesthesia Complications Negative for: history of anesthetic complications  Airway Mallampati: III  TM Distance: <3 FB Neck ROM: full    Dental  (+) Chipped, Poor Dentition, Missing   Pulmonary neg shortness of breath, former smoker   Pulmonary exam normal        Cardiovascular Exercise Tolerance: Good hypertension, (-) angina Normal cardiovascular exam     Neuro/Psych  PSYCHIATRIC DISORDERS      negative neurological ROS     GI/Hepatic Neg liver ROS,GERD  Controlled,,  Endo/Other  negative endocrine ROS    Renal/GU negative Renal ROS  negative genitourinary   Musculoskeletal   Abdominal   Peds  Hematology negative hematology ROS (+)   Anesthesia Other Findings Past Medical History: 05/04/2016: Abnormal uterine bleeding (AUB)     Comment:  Last Assessment & Plan:   Appears to be AUB-P vs AUB-M;              likely cervical polyp visualized on speculum exam and EMB              performed  TVUS ordered  My suspicion is that this is               most likely a benign process. As she does have a 30 pack               year history, I counseled the patient extensively on               smoking cessation, as this is her biggest risk factor for              chronic disease.   Endometrial Biopsy Procedure Note                Urine p No date: Anxiety 06/06/2020: COVID No date: Depression No date: Esophageal dysphagia No date: Esophageal dysphagia No date: GERD (gastroesophageal reflux disease) No date: Hyperlipidemia No date: Hypertension No date: Insomnia No date: Osteopenia No date: Vitamin D deficiency  Past Surgical History: 01/24/2023: BIOPSY     Comment:  Procedure: BIOPSY;  Surgeon: Regis Bill, MD;                 Location: [facility];  Service: Endoscopy;; 20+ yrs ago: BREAST EXCISIONAL BIOPSY; Left     Comment:  neg No date: BREAST SURGERY No date: COLONOSCOPY WITH ESOPHAGOGASTRODUODENOSCOPY (EGD) 10/10/2019: COLONOSCOPY WITH PROPOFOL; N/A     Comment:  Procedure: COLONOSCOPY WITH PROPOFOL;  Surgeon: [place],               Boykin Nearing, MD;  Location: [facility] END[facility]COPY;  Service:               Gastroenterology;  Laterality: N/A; 01/24/2023: COLONOSCOPY WITH PROPOFOL; N/A     Comment:  Procedure: COLONOSCOPY WITH PROPOFOL;  Surgeon:               Regis Bill, MD;  Location: [facility]COPY;                Service: Endoscopy;  Laterality: N/A; 2000: ESOPHAGOGASTRIC FUNDOPLICATION 01/24/2023: ESOPHAGOGASTRODUODENOSCOPY (EGD) WITH PROPOFOL; N/A     Comment:  Procedure: ESOPHAGOGASTRODUODENOSCOPY (EGD) WITH               PROPOFOL;  Surgeon: Mia Creek,  Rossie Muskrat, MD;  Location:               [facility];  Service: Endoscopy;  Laterality: N/A; No date: SQUAMOUS CELL CARCINOMA EXCISION; Right  BMI    Body Mass Index: 28.29 kg/m      Reproductive/Obstetrics negative OB ROS                             Anesthesia Physical Anesthesia Plan  ASA: 2  Anesthesia Plan: General   Post-op Pain Management:    Induction: Intravenous  PONV Risk Score and Plan: Propofol infusion and TIVA  Airway Management Planned: Natural Airway and Nasal Cannula  Additional Equipment:   Intra-op Plan:   Post-operative Plan:   Informed Consent: I have reviewed the patients History and Physical, chart, labs and discussed the procedure including the risks, benefits and alternatives for the proposed anesthesia with the patient or authorized representative who has indicated his/her understanding and acceptance.     Dental Advisory Given  Plan Discussed with: Anesthesiologist, CRNA and Surgeon  Anesthesia Plan Comments: (Patient consented for risks of anesthesia  including but not limited to:  - adverse reactions to medications - risk of airway placement if required - damage to eyes, teeth, lips or other oral mucosa - nerve damage due to positioning  - sore throat or hoarseness - Damage to heart, brain, nerves, lungs, other parts of body or loss of life  Patient voiced understanding and assent.)       Anesthesia Quick Evaluation

## 2023-05-09 NOTE — Anesthesia Postprocedure Evaluation (Signed)
Anesthesia Post Note  Patient: Pamela Barrett  Procedure(s) Performed: ESOPHAGOGASTRODUODENOSCOPY (EGD) WITH PROPOFOL  Patient location during evaluation: Endoscopy Anesthesia Type: General Level of consciousness: awake and alert Pain management: pain level controlled Vital Signs Assessment: post-procedure vital signs reviewed and stable Respiratory status: spontaneous breathing, nonlabored ventilation, respiratory function stable and patient connected to nasal cannula oxygen Cardiovascular status: blood pressure returned to baseline and stable Postop Assessment: no apparent nausea or vomiting Anesthetic complications: no   There were no known notable events for this encounter.   Last Vitals:  Vitals:   05/09/23 0958 05/09/23 1009  BP: 105/72 132/83  Pulse: 67 61  Resp: 13 15  Temp:    SpO2: 100% (!) 83%    Last Pain:  Vitals:   05/09/23 0953  TempSrc: Temporal  PainSc:                  [name] [name]

## 2023-05-09 NOTE — Interval H&P Note (Signed)
History and Physical Interval Note:  05/09/2023 9:33 AM  Pamela Barrett  has presented today for surgery, with the diagnosis of esophagitis.  The various methods of treatment have been discussed with the patient and family. After consideration of risks, benefits and other options for treatment, the patient has consented to  Procedure(s): ESOPHAGOGASTRODUODENOSCOPY (EGD) WITH PROPOFOL (N/A) as a surgical intervention.  The patient's history has been reviewed, patient examined, no change in status, stable for surgery.  I have reviewed the patient's chart and labs.  Questions were answered to the patient's satisfaction.     Regis Bill  Ok to proceed with EGD

## 2023-05-09 NOTE — H&P (Signed)
Outpatient short stay form Pre-procedure 05/09/2023  Regis Bill, MD  Primary Physician: Marjie Skiff, NP  Reason for visit:  History of esophagitis  History of present illness:    67 y/o lady with history of hiatal hernia repair with recent history of severe esophagitis here to assess healing. Her dysphagia symptoms have resolved. No blood thinners. Sister with colon cancer at age 51. No other abdominal surgeries besides the hiatal hernia repair.     Current Facility-Administered Medications:    0.9 %  sodium chloride infusion, , Intravenous, Continuous, Jon Lall, Rossie Muskrat, MD, Last Rate: 20 mL/hr at 05/09/23 0909, Continued from Pre-op at 05/09/23 0909  Medications Prior to Admission  Medication Sig Dispense Refill Last Dose   olmesartan (BENICAR) 5 MG tablet Take 1 tablet (5 mg total) by mouth daily. 60 tablet 4 05/09/2023   aspirin EC 81 MG tablet Take 81 mg by mouth daily.      busPIRone (BUSPAR) 5 MG tablet Take 1 tablet (5 mg total) by mouth as needed (for anxiety). Take twice daily as needed by mouth for anxiety. 180 tablet 4    cholecalciferol (VITAMIN D3) 25 MCG (1000 UNIT) tablet Take 1,000 Units by mouth daily.      citalopram (CELEXA) 20 MG tablet TAKE 1 TABLET(20 MG) BY MOUTH DAILY 90 tablet 4    Omega-3 Fatty Acids (FISH OIL) 1000 MG CAPS Take 1 capsule by mouth daily.      ondansetron (ZOFRAN ODT) 4 MG disintegrating tablet Take 1-2 tablets (4-8 mg total) by mouth every 8 (eight) hours as needed for nausea or vomiting. 60 tablet 3    pantoprazole (PROTONIX) 40 MG tablet TAKE 1 TABLET(40 MG) BY MOUTH DAILY 90 tablet 0    QUEtiapine (SEROQUEL) 25 MG tablet TAKE 1 TABLET(25 MG) BY MOUTH AT BEDTIME 90 tablet 4    valACYclovir (VALTREX) 1000 MG tablet Take 1 tablet (1,000 mg total) by mouth 3 (three) times daily for 10 days. 30 tablet 0      Allergies  Allergen Reactions   Prednisone Anxiety     Past Medical History:  Diagnosis Date   Abnormal uterine  bleeding (AUB) 05/04/2016   Last Assessment & Plan:   Appears to be AUB-P vs AUB-M; likely cervical polyp visualized on speculum exam and EMB performed  TVUS ordered  My suspicion is that this is most likely a benign process. As she does have a 30 pack year history, I counseled the patient extensively on smoking cessation, as this is her biggest risk factor for chronic disease.   Endometrial Biopsy Procedure Note  Urine p   Anxiety    COVID 06/06/2020   Depression    Esophageal dysphagia    Esophageal dysphagia    GERD (gastroesophageal reflux disease)    Hyperlipidemia    Hypertension    Insomnia    Osteopenia    Vitamin D deficiency     Review of systems:  Otherwise negative.    Physical Exam  Gen: Alert, oriented. Appears stated age.  HEENT: PERRLA. Lungs: No respiratory distress CV: RRR Abd: soft, benign, no masses Ext: No edema    Planned procedures: Proceed with EGD. The patient understands the nature of the planned procedure, indications, risks, alternatives and potential complications including but not limited to bleeding, infection, perforation, damage to internal organs and possible oversedation/side effects from anesthesia. The patient agrees and gives consent to proceed.  Please refer to procedure notes for findings, recommendations and patient disposition/instructions.  Regis Bill, MD [facility]

## 2023-05-09 NOTE — Transfer of Care (Signed)
Immediate Anesthesia Transfer of Care Note  Patient: Pamela Barrett  Procedure(s) Performed: ESOPHAGOGASTRODUODENOSCOPY (EGD) WITH PROPOFOL  Patient Location: PACU  Anesthesia Type:General  Level of Consciousness: sedated  Airway & Oxygen Therapy: Patient Spontanous Breathing  Post-op Assessment: Report given to RN and Post -op Vital signs reviewed and stable  Post vital signs: Reviewed and stable  Last Vitals:  Vitals Value Taken Time  BP    Temp    Pulse    Resp    SpO2      Last Pain:  Vitals:   05/09/23 0834  TempSrc: Temporal  PainSc: 0-No pain         Complications: There were no known notable events for this encounter.

## 2023-05-09 NOTE — Op Note (Signed)
[facility] Patient Name: Pamela Barrett Procedure Date: 05/09/2023 9:28 AM MRN: [id number] Account #: [id number] Date of Birth: 1956-05-27 Admit Type: Outpatient Age: 67 Room: [facility] ENDO ROOM 3 Gender: Female Note Status: Finalized Instrument Name: Upper Endoscope [id number] Procedure:             Upper GI endoscopy Indications:           Follow-up of reflux esophagitis Providers:             Eather Colas MD, MD Referring MD:          Dorie Rank. Harvest Dark (Referring MD) Medicines:             Monitored Anesthesia Care Complications:         No immediate complications. Procedure:             Pre-Anesthesia Assessment:                        - Prior to the procedure, a History and Physical was                         performed, and patient medications and allergies were                         reviewed. The patient is competent. The risks and                         benefits of the procedure and the sedation options and                         risks were discussed with the patient. All questions                         were answered and informed consent was obtained.                         Patient identification and proposed procedure were                         verified by the physician, the nurse, the                         anesthesiologist, the anesthetist and the technician                         in the endoscopy suite. Mental Status Examination:                         alert and oriented. Airway Examination: normal                         oropharyngeal airway and neck mobility. Respiratory                         Examination: clear to auscultation. CV Examination:                         normal. Prophylactic Antibiotics: The patient does not  require prophylactic antibiotics. Prior                         Anticoagulants: The patient has taken no anticoagulant                         or antiplatelet agents. ASA Grade  Assessment: II - A                         patient with mild systemic disease. After reviewing                         the risks and benefits, the patient was deemed in                         satisfactory condition to undergo the procedure. The                         anesthesia plan was to use monitored anesthesia care                         (MAC). Immediately prior to administration of                         medications, the patient was re-assessed for adequacy                         to receive sedatives. The heart rate, respiratory                         rate, oxygen saturations, blood pressure, adequacy of                         pulmonary ventilation, and response to care were                         monitored throughout the procedure. The physical                         status of the patient was re-assessed after the                         procedure.                        After obtaining informed consent, the endoscope was                         passed under direct vision. Throughout the procedure,                         the patient's blood pressure, pulse, and oxygen                         saturations were monitored continuously. The Endoscope                         was introduced through the mouth, and advanced to the  second part of duodenum. The upper GI endoscopy was                         accomplished without difficulty. The patient tolerated                         the procedure well. Findings:      Normal mucosa was found in the entire esophagus.      A 2 cm hiatal hernia was present.      The entire examined stomach was normal.      The examined duodenum was normal. Impression:            - Normal mucosa was found in the entire esophagus.                        - 2 cm hiatal hernia.                        - Normal stomach.                        - Normal examined duodenum.                        - No specimens  collected. Recommendation:        - Discharge patient to home.                        - Resume previous diet.                        - Continue present medications.                        - Return to referring physician as previously                         scheduled. Procedure Code(s):     --- Professional ---                        [phone], Esophagogastroduodenoscopy, flexible,                         transoral; diagnostic, including collection of                         specimen(s) by brushing or washing, when performed                         (separate procedure) Diagnosis Code(s):     --- Professional ---                        K44.9, Diaphragmatic hernia without obstruction or                         gangrene                        K21.00, Gastro-esophageal reflux disease with  esophagitis, without bleeding CPT copyright 2022 American Medical Association. All rights reserved. The codes documented in this report are preliminary and upon coder review may  be revised to meet current compliance requirements. Eather Colas MD, MD 05/09/2023 9:49:39 AM Number of Addenda: 0 Note Initiated On: 05/09/2023 9:28 AM Estimated Blood Loss:  Estimated blood loss: none.      [facility]

## 2023-05-10 ENCOUNTER — Encounter: Payer: Self-pay | Admitting: Gastroenterology

## 2023-05-13 ENCOUNTER — Other Ambulatory Visit: Payer: Self-pay | Admitting: Nurse Practitioner

## 2023-05-14 NOTE — Patient Instructions (Signed)
Be Involved in Caring For Your Health:  Taking Medications When medications are taken as directed, they can greatly improve your health. But if they are not taken as prescribed, they may not work. In some cases, not taking them correctly can be harmful. To help ensure your treatment remains effective and safe, understand your medications and how to take them. Bring your medications to each visit for review by your provider.  Your lab results, notes, and after visit summary will be available on My Chart. We strongly encourage you to use this feature. If lab results are abnormal the clinic will contact you with the appropriate steps. If the clinic does not contact you assume the results are satisfactory. You can always view your results on My Chart. If you have questions regarding your health or results, please contact the clinic during office hours. You can also ask questions on My Chart.  We at [facility] are grateful that you chose [place] to provide your care. We strive to provide evidence-based and compassionate care and are always looking for feedback. If you get a survey from the clinic please complete this so we can hear your opinions.  Heart-Healthy Eating Plan Many factors influence your heart health, including eating and exercise habits. Heart health is also called coronary health. Coronary risk increases with abnormal blood fat (lipid) levels. A heart-healthy eating plan includes limiting unhealthy fats, increasing healthy fats, limiting salt (sodium) intake, and making other diet and lifestyle changes. What is my plan? Your health care provider may recommend that: You limit your fat intake to _________% or less of your total calories each day. You limit your saturated fat intake to _________% or less of your total calories each day. You limit the amount of cholesterol in your diet to less than _________ mg per day. You limit the amount of sodium in your diet to less than _________  mg per day. What are tips for following this plan? Cooking Cook foods using methods other than frying. Baking, boiling, grilling, and broiling are all good options. Other ways to reduce fat include: Removing the skin from poultry. Removing all visible fats from meats. Steaming vegetables in water or broth. Meal planning  At meals, imagine dividing your plate into fourths: Fill one-half of your plate with vegetables and green salads. Fill one-fourth of your plate with whole grains. Fill one-fourth of your plate with lean protein foods. Eat 2-4 cups of vegetables per day. One cup of vegetables equals 1 cup (91 g) broccoli or cauliflower florets, 2 medium carrots, 1 large bell pepper, 1 large sweet potato, 1 large tomato, 1 medium white potato, 2 cups (150 g) raw leafy greens. Eat 1-2 cups of fruit per day. One cup of fruit equals 1 small apple, 1 large banana, 1 cup (237 g) mixed fruit, 1 large orange,  cup (82 g) dried fruit, 1 cup (240 mL) 100% fruit juice. Eat more foods that contain soluble fiber. Examples include apples, broccoli, carrots, beans, peas, and barley. Aim to get 25-30 g of fiber per day. Increase your consumption of legumes, nuts, and seeds to 4-5 servings per week. One serving of dried beans or legumes equals  cup (90 g) cooked, 1 serving of nuts is  oz (12 almonds, 24 pistachios, or 7 walnut halves), and 1 serving of seeds equals  oz (8 g). Fats Choose healthy fats more often. Choose monounsaturated and polyunsaturated fats, such as olive and canola oils, avocado oil, flaxseeds, walnuts, almonds, and seeds. Eat  more omega-3 fats. Choose salmon, mackerel, sardines, tuna, flaxseed oil, and ground flaxseeds. Aim to eat fish at least 2 times each week. Check food labels carefully to identify foods with trans fats or high amounts of saturated fat. Limit saturated fats. These are found in animal products, such as meats, butter, and cream. Plant sources of saturated fats  include palm oil, palm kernel oil, and coconut oil. Avoid foods with partially hydrogenated oils in them. These contain trans fats. Examples are stick margarine, some tub margarines, cookies, crackers, and other baked goods. Avoid fried foods. General information Eat more home-cooked food and less restaurant, buffet, and fast food. Limit or avoid alcohol. Limit foods that are high in added sugar and simple starches such as foods made using white refined flour (white breads, pastries, sweets). Lose weight if you are overweight. Losing just 5-10% of your body weight can help your overall health and prevent diseases such as diabetes and heart disease. Monitor your sodium intake, especially if you have high blood pressure. Talk with your health care provider about your sodium intake. Try to incorporate more vegetarian meals weekly. What foods should I eat? Fruits All fresh, canned (in natural juice), or frozen fruits. Vegetables Fresh or frozen vegetables (raw, steamed, roasted, or grilled). Green salads. Grains Most grains. Choose whole wheat and whole grains most of the time. Rice and pasta, including brown rice and pastas made with whole wheat. Meats and other proteins Lean, well-trimmed beef, veal, pork, and lamb. Chicken and [place] without skin. All fish and shellfish. Wild duck, rabbit, pheasant, and venison. Egg whites or low-cholesterol egg substitutes. Dried beans, peas, lentils, and tofu. Seeds and most nuts. Dairy Low-fat or nonfat cheeses, including ricotta and mozzarella. Skim or 1% milk (liquid, powdered, or evaporated). Buttermilk made with low-fat milk. Nonfat or low-fat yogurt. Fats and oils Non-hydrogenated (trans-free) margarines. Vegetable oils, including soybean, sesame, sunflower, olive, avocado, peanut, safflower, corn, canola, and cottonseed. Salad dressings or mayonnaise made with a vegetable oil. Beverages Water (mineral or sparkling). Coffee and tea. Unsweetened ice  tea. Diet beverages. Sweets and desserts Sherbet, gelatin, and fruit ice. Small amounts of dark chocolate. Limit all sweets and desserts. Seasonings and condiments All seasonings and condiments. The items listed above may not be a complete list of foods and beverages you can eat. Contact a dietitian for more options. What foods should I avoid? Fruits Canned fruit in heavy syrup. Fruit in cream or butter sauce. Fried fruit. Limit coconut. Vegetables Vegetables cooked in cheese, cream, or butter sauce. Fried vegetables. Grains Breads made with saturated or trans fats, oils, or whole milk. Croissants. Sweet rolls. Donuts. High-fat crackers, such as cheese crackers and chips. Meats and other proteins Fatty meats, such as hot dogs, ribs, sausage, bacon, rib-eye roast or steak. High-fat deli meats, such as salami and bologna. Caviar. Domestic duck and goose. Organ meats, such as liver. Dairy Cream, sour cream, cream cheese, and creamed cottage cheese. Whole-milk cheeses. Whole or 2% milk (liquid, evaporated, or condensed). Whole buttermilk. Cream sauce or high-fat cheese sauce. Whole-milk yogurt. Fats and oils Meat fat, or shortening. Cocoa butter, hydrogenated oils, palm oil, coconut oil, palm kernel oil. Solid fats and shortenings, including bacon fat, salt pork, lard, and butter. Nondairy cream substitutes. Salad dressings with cheese or sour cream. Beverages Regular sodas and any drinks with added sugar. Sweets and desserts Frosting. Pudding. Cookies. Cakes. Pies. Milk chocolate or white chocolate. Buttered syrups. Full-fat ice cream or ice cream drinks. The items listed above may  not be a complete list of foods and beverages to avoid. Contact a dietitian for more information. Summary Heart-healthy meal planning includes limiting unhealthy fats, increasing healthy fats, limiting salt (sodium) intake and making other diet and lifestyle changes. Lose weight if you are overweight. Losing just  5-10% of your body weight can help your overall health and prevent diseases such as diabetes and heart disease. Focus on eating a balance of foods, including fruits and vegetables, low-fat or nonfat dairy, lean protein, nuts and legumes, whole grains, and heart-healthy oils and fats. This information is not intended to replace advice given to you by your health care provider. Make sure you discuss any questions you have with your health care provider. Document Revised: 06/29/2021 Document Reviewed: 06/29/2021 Elsevier Patient Education  2024 [employer].

## 2023-05-16 ENCOUNTER — Encounter: Payer: Self-pay | Admitting: Nurse Practitioner

## 2023-05-16 ENCOUNTER — Ambulatory Visit (INDEPENDENT_AMBULATORY_CARE_PROVIDER_SITE_OTHER): Payer: Medicare Other | Admitting: Nurse Practitioner

## 2023-05-16 VITALS — BP 106/70 | HR 96 | Temp 98.1°F | Ht 65.5 in | Wt 171.2 lb

## 2023-05-16 DIAGNOSIS — R21 Rash and other nonspecific skin eruption: Secondary | ICD-10-CM | POA: Diagnosis not present

## 2023-05-16 DIAGNOSIS — I10 Essential (primary) hypertension: Secondary | ICD-10-CM

## 2023-05-16 MED ORDER — OLMESARTAN MEDOXOMIL 5 MG PO TABS: 5.0000 mg | ORAL_TABLET | Freq: Every day | ORAL | 4 refills | Status: DC

## 2023-05-16 MED ORDER — PANTOPRAZOLE SODIUM 40 MG PO TBEC: 40.0000 mg | DELAYED_RELEASE_TABLET | Freq: Two times a day (BID) | ORAL | 4 refills | Status: DC

## 2023-05-16 NOTE — Telephone Encounter (Signed)
Requested Prescriptions  Pending Prescriptions Disp Refills   pantoprazole (PROTONIX) 40 MG tablet [Pharmacy Med Name: PANTOPRAZOLE 40MG  TABLETS] 180 tablet 0    Sig: TAKE 1 TABLET(40 MG) BY MOUTH TWICE DAILY BEFORE A MEAL     Gastroenterology: Proton Pump Inhibitors Passed - 05/13/2023  3:17 AM      Passed - Valid encounter within last 12 months    Recent Outpatient Visits           2 weeks ago Essential hypertension   [place] [facility] [place], Corrie Dandy T, NP   1 month ago Cat bite, subsequent encounter   [place] [facility] [place], Sherran Needs, NP   2 months ago Cellulitis of left upper extremity   [place] [facility] [place], Sherran Needs, NP   2 months ago Anxiety   [place] [facility] [place], Clayton T, NP   7 months ago Gastroesophageal reflux disease with esophagitis without hemorrhage   [place] [facility] [place], Dorie Rank, NP       Future Appointments             Today Marjie Skiff, NP [place] [facility]   In 3 months [place], Dorie Rank, NP [place] [facility]

## 2023-05-16 NOTE — Assessment & Plan Note (Addendum)
Acute and improved.  Completed Valtrex course and overall reports improvement in symptoms.  Recommend she obtain Shingrix vaccines in 3 months.

## 2023-05-16 NOTE — Assessment & Plan Note (Addendum)
Ongoing, improved with low dose Olmesartan.  Will continue Olmesartan 5 MG daily, educated her on this medication and use + side effects to monitor for.  Recommend she monitor BP at least a few mornings a week at home and document.  DASH diet at home.  Labs today: up to date.  Return as scheduled in March, check labs then.

## 2023-05-16 NOTE — Progress Notes (Signed)
BP 106/70   Pulse 96   Temp 98.1 F (36.7 C) (Oral)   Ht 5' 5.5" (1.664 m)   Wt 171 lb 3.2 oz (77.7 kg)   SpO2 98%   BMI 28.06 kg/m    Subjective:    Patient ID: Pamela Barrett, female    DOB: [birth year], 67 y.o.   MRN: [id number]  HPI: Pamela Barrett is a 67 y.o. female  Chief Complaint  Patient presents with   Herpes Zoster   Hypertension   HYPERTENSION without Chronic Kidney Disease Follow-up for addition of BP medication on 05/02/23 -- Olmesartan 5 MG daily, due to elevation in BP levels recently. Tolerating this well.  Was treated for shingles on11/25/24 with Valtrex. Completed full course and overall rash has improved she reports.   Hypertension status: stable  Satisfied with current treatment? yes Duration of hypertension: chronic BP monitoring frequency:  not checking BP range:  BP medication side effects:  no Medication compliance: good compliance Aspirin: yes Recurrent headaches: no Visual changes: no Palpitations: no Dyspnea: no Chest pain: no Lower extremity edema: no Dizzy/lightheaded: no   Relevant past medical, surgical, family and social history reviewed and updated as indicated. Interim medical history since our last visit reviewed. Allergies and medications reviewed and updated.  Review of Systems  Constitutional:  Negative for activity change, appetite change, diaphoresis, fatigue and fever.  Respiratory:  Negative for cough, chest tightness, shortness of breath and wheezing.   Cardiovascular:  Negative for chest pain, palpitations and leg swelling.  Gastrointestinal: Negative.   Skin:  Negative for rash.  Neurological: Negative.   Psychiatric/Behavioral: Negative.     Per HPI unless specifically indicated above     Objective:    BP 106/70   Pulse 96   Temp 98.1 F (36.7 C) (Oral)   Ht 5' 5.5" (1.664 m)   Wt 171 lb 3.2 oz (77.7 kg)   SpO2 98%   BMI 28.06 kg/m   Wt Readings from Last 3 Encounters:  05/16/23 171 lb 3.2 oz  (77.7 kg)  05/09/23 170 lb (77.1 kg)  05/02/23 171 lb 9.6 oz (77.8 kg)    Physical Exam Vitals and nursing note reviewed.  Constitutional:      General: She is awake. She is not in acute distress.    Appearance: Normal appearance. She is well-developed and well-groomed. She is not ill-appearing or toxic-appearing.  HENT:     Head: Normocephalic.     Right Ear: Hearing and external ear normal.     Left Ear: Hearing and external ear normal.  Eyes:     General: Lids are normal.        Right eye: No discharge.        Left eye: No discharge.     Conjunctiva/sclera: Conjunctivae normal.     Pupils: Pupils are equal, round, and reactive to light.  Neck:     Thyroid: No thyromegaly.     Vascular: No carotid bruit.  Cardiovascular:     Rate and Rhythm: Normal rate and regular rhythm.     Heart sounds: Normal heart sounds. No murmur heard.    No gallop.  Pulmonary:     Effort: Pulmonary effort is normal. No accessory muscle usage or respiratory distress.     Breath sounds: Normal breath sounds.  Abdominal:     General: Bowel sounds are normal. There is no distension.     Palpations: Abdomen is soft.     Tenderness: There is no abdominal  tenderness.  Musculoskeletal:     Cervical back: Normal range of motion and neck supple.     Right lower leg: No edema.     Left lower leg: No edema.  Lymphadenopathy:     Cervical: No cervical adenopathy.  Skin:    General: Skin is warm and dry.  Neurological:     Mental Status: She is alert and oriented to person, place, and time.     Deep Tendon Reflexes: Reflexes are normal and symmetric.     Reflex Scores:      Brachioradialis reflexes are 2+ on the right side and 2+ on the left side.      Patellar reflexes are 2+ on the right side and 2+ on the left side. Psychiatric:        Attention and Perception: Attention normal.        Mood and Affect: Mood normal.        Speech: Speech normal.        Behavior: Behavior normal. Behavior is  cooperative.        Thought Content: Thought content normal.    Results for orders placed or performed in visit on 08/20/22  CBC with Differential/Platelet  Result Value Ref Range   WBC 5.0 3.4 - 10.8 x10E3/uL   RBC 4.79 3.77 - 5.28 x10E6/uL   Hemoglobin 13.9 11.1 - 15.9 g/dL   Hematocrit 54.0 08.6 - 46.6 %   MCV 88 79 - 97 fL   MCH 29.0 26.6 - 33.0 pg   MCHC 33.2 31.5 - 35.7 g/dL   RDW 76.1 95.0 - 93.2 %   Platelets 202 150 - 450 x10E3/uL   Neutrophils 71 Not Estab. %   Lymphs 21 Not Estab. %   Monocytes 6 Not Estab. %   Eos 1 Not Estab. %   Basos 1 Not Estab. %   Neutrophils Absolute 3.5 1.4 - 7.0 x10E3/uL   Lymphocytes Absolute 1.1 0.7 - 3.1 x10E3/uL   Monocytes Absolute 0.3 0.1 - 0.9 x10E3/uL   EOS (ABSOLUTE) 0.1 0.0 - 0.4 x10E3/uL   Basophils Absolute 0.0 0.0 - 0.2 x10E3/uL   Immature Granulocytes 0 Not Estab. %   Immature Grans (Abs) 0.0 0.0 - 0.1 x10E3/uL  Comprehensive metabolic panel  Result Value Ref Range   Glucose 98 70 - 99 mg/dL   BUN 12 8 - 27 mg/dL   Creatinine, Ser 6.71 0.57 - 1.00 mg/dL   eGFR 88 >24 PY/KDX/8.33   BUN/Creatinine Ratio 16 12 - 28   Sodium 140 134 - 144 mmol/L   Potassium 4.1 3.5 - 5.2 mmol/L   Chloride 104 96 - 106 mmol/L   CO2 22 20 - 29 mmol/L   Calcium 9.1 8.7 - 10.3 mg/dL   Total Protein 6.4 6.0 - 8.5 g/dL   Albumin 4.4 3.9 - 4.9 g/dL   Globulin, Total 2.0 1.5 - 4.5 g/dL   Albumin/Globulin Ratio 2.2 1.2 - 2.2   Bilirubin Total 0.3 0.0 - 1.2 mg/dL   Alkaline Phosphatase 82 44 - 121 IU/L   AST 16 0 - 40 IU/L   ALT 12 0 - 32 IU/L  Lipid Panel w/o Chol/HDL Ratio  Result Value Ref Range   Cholesterol, Total 228 (H) 100 - 199 mg/dL   Triglycerides 97 0 - 149 mg/dL   HDL 71 >82 mg/dL   VLDL Cholesterol Cal 17 5 - 40 mg/dL   LDL Chol Calc (NIH) 505 (H) 0 - 99 mg/dL  TSH  Result Value  Ref Range   TSH 0.756 0.450 - 4.500 uIU/mL  VITAMIN D 25 Hydroxy (Vit-D Deficiency, Fractures)  Result Value Ref Range   Vit D, 25-Hydroxy 38.4  30.0 - 100.0 ng/mL      Assessment & Plan:   Problem List Items Addressed This Visit       Cardiovascular and Mediastinum   Essential hypertension - Primary    Ongoing, improved with low dose Olmesartan.  Will continue Olmesartan 5 MG daily, educated her on this medication and use + side effects to monitor for.  Recommend she monitor BP at least a few mornings a week at home and document.  DASH diet at home.  Labs today: up to date.  Return as scheduled in March, check labs then.      Relevant Medications   olmesartan (BENICAR) 5 MG tablet     Musculoskeletal and Integument   Rash    Acute and improved.  Completed Valtrex course and overall reports improvement in symptoms.  Recommend she obtain Shingrix vaccines in 3 months.        Follow up plan: Return for as scheduled March 13th, 2025.

## 2023-07-01 ENCOUNTER — Other Ambulatory Visit: Payer: Self-pay | Admitting: Nurse Practitioner

## 2023-07-01 NOTE — Telephone Encounter (Signed)
Requested medication (s) are due for refill today: yes  Requested medication (s) are on the active medication list: yes  Last refill:  08/20/22  Future visit scheduled: yes  Notes to clinic:  Unable to refill per protocol, cannot delegate.      Requested Prescriptions  Pending Prescriptions Disp Refills   QUEtiapine (SEROQUEL) 25 MG tablet [Pharmacy Med Name: QUETIAPINE 25MG  TABLETS] 90 tablet 4    Sig: TAKE 1 TABLET(25 MG) BY MOUTH AT BEDTIME     Not Delegated - Psychiatry:  Antipsychotics - Second Generation (Atypical) - quetiapine Failed - 07/01/2023  8:45 AM      Failed - This refill cannot be delegated      Failed - Lipid Panel in normal range within the last 12 months    Cholesterol, Total  Date Value Ref Range Status  08/20/2022 228 (H) 100 - 199 mg/dL Final   LDL Chol Calc (NIH)  Date Value Ref Range Status  08/20/2022 140 (H) 0 - 99 mg/dL Final   HDL  Date Value Ref Range Status  08/20/2022 71 >39 mg/dL Final   Triglycerides  Date Value Ref Range Status  08/20/2022 97 0 - 149 mg/dL Final         Passed - TSH in normal range and within 360 days    TSH  Date Value Ref Range Status  08/20/2022 0.756 0.450 - 4.500 uIU/mL Final         Passed - Last BP in normal range    BP Readings from Last 1 Encounters:  05/16/23 106/70         Passed - Last Heart Rate in normal range    Pulse Readings from Last 1 Encounters:  05/16/23 96         Passed - Valid encounter within last 6 months    Recent Outpatient Visits           1 month ago Essential hypertension   [place] [facility] [place], Corrie Dandy T, NP   2 months ago Essential hypertension   [place] [facility] [place], Saticoy T, NP   3 months ago Cat bite, subsequent encounter   [place] [facility] Pearley, Sherran Needs, NP   3 months ago Cellulitis of left upper extremity   [place] [facility] Pearley, Sherran Needs, NP   4 months  ago Anxiety   [place] [facility] [place], McRae-Helena T, NP       Future Appointments             In 1 month [place], [place] T, NP [place] [facility], [facility]            Passed - CBC within normal limits and completed in the last 12 months    WBC  Date Value Ref Range Status  08/20/2022 5.0 3.4 - 10.8 x10E3/uL Final   RBC  Date Value Ref Range Status  08/20/2022 4.79 3.77 - 5.28 x10E6/uL Final   Hemoglobin  Date Value Ref Range Status  08/20/2022 13.9 11.1 - 15.9 g/dL Final   Hematocrit  Date Value Ref Range Status  08/20/2022 41.9 34.0 - 46.6 % Final   MCHC  Date Value Ref Range Status  08/20/2022 33.2 31.5 - 35.7 g/dL Final   [facility]  Date Value Ref Range Status  08/20/2022 29.0 26.6 - 33.0 pg Final   MCV  Date Value Ref Range Status  08/20/2022 88 79 - 97 fL Final  No results found for: "PLTCOUNTKUC", "LABPLAT", "POCPLA" RDW  Date Value Ref Range Status  08/20/2022 12.2 11.7 - 15.4 % Final         Passed - CMP within normal limits and completed in the last 12 months    Albumin  Date Value Ref Range Status  08/20/2022 4.4 3.9 - 4.9 g/dL Final   Alkaline Phosphatase  Date Value Ref Range Status  08/20/2022 82 44 - 121 IU/L Final   ALT  Date Value Ref Range Status  08/20/2022 12 0 - 32 IU/L Final   AST  Date Value Ref Range Status  08/20/2022 16 0 - 40 IU/L Final   BUN  Date Value Ref Range Status  08/20/2022 12 8 - 27 mg/dL Final   Calcium  Date Value Ref Range Status  08/20/2022 9.1 8.7 - 10.3 mg/dL Final   CO2  Date Value Ref Range Status  08/20/2022 22 20 - 29 mmol/L Final   Creatinine, Ser  Date Value Ref Range Status  08/20/2022 0.75 0.57 - 1.00 mg/dL Final   Glucose  Date Value Ref Range Status  08/20/2022 98 70 - 99 mg/dL Final   Potassium  Date Value Ref Range Status  08/20/2022 4.1 3.5 - 5.2 mmol/L Final   Sodium  Date Value Ref Range Status  08/20/2022 140 134 - 144 mmol/L Final    Bilirubin Total  Date Value Ref Range Status  08/20/2022 0.3 0.0 - 1.2 mg/dL Final   Total Protein  Date Value Ref Range Status  08/20/2022 6.4 6.0 - 8.5 g/dL Final   GFR calc Af Amer  Date Value Ref Range Status  07/25/2020 99 >59 mL/min/1.73 Final    Comment:    **In accordance with recommendations from the NKF-ASN Task force,**   [facility] is in the process of updating its eGFR calculation to the   2021 CKD-EPI creatinine equation that estimates kidney function   without a race variable.    eGFR  Date Value Ref Range Status  08/20/2022 88 >59 mL/min/1.73 Final   GFR calc non Af Amer  Date Value Ref Range Status  07/25/2020 86 >59 mL/min/1.73 Final

## 2023-08-20 NOTE — Patient Instructions (Signed)
 Be Involved in Caring For Your Health:  Taking Medications When medications are taken as directed, they can greatly improve your health. But if they are not taken as prescribed, they may not work. In some cases, not taking them correctly can be harmful. To help ensure your treatment remains effective and safe, understand your medications and how to take them. Bring your medications to each visit for review by your provider.  Your lab results, notes, and after visit summary will be available on My Chart. We strongly encourage you to use this feature. If lab results are abnormal the clinic will contact you with the appropriate steps. If the clinic does not contact you assume the results are satisfactory. You can always view your results on My Chart. If you have questions regarding your health or results, please contact the clinic during office hours. You can also ask questions on My Chart.  We at [facility] are grateful that you chose [place] to provide your care. We strive to provide evidence-based and compassionate care and are always looking for feedback. If you get a survey from the clinic please complete this so we can hear your opinions.  Healthy Eating, Adult Healthy eating may help you get and keep a healthy body weight, reduce the risk of chronic disease, and live a long and productive life. It is important to follow a healthy eating pattern. Your nutritional and calorie needs should be met mainly by different nutrient-rich foods. What are tips for following this plan? Reading food labels Read labels and choose the following: Reduced or low sodium products. Juices with 100% fruit juice. Foods with low saturated fats (<3 g per serving) and high polyunsaturated and monounsaturated fats. Foods with whole grains, such as whole wheat, cracked wheat, brown rice, and wild rice. Whole grains that are fortified with folic acid. This is recommended for females who are pregnant or who want  to become pregnant. Read labels and do not eat or drink the following: Foods or drinks with added sugars. These include foods that contain brown sugar, corn sweetener, corn syrup, dextrose, fructose, glucose, high-fructose corn syrup, honey, invert sugar, lactose, malt syrup, maltose, molasses, raw sugar, sucrose, trehalose, or turbinado sugar. Limit your intake of added sugars to less than 10% of your total daily calories. Do not eat more than the following amounts of added sugar per day: 6 teaspoons (25 g) for females. 9 teaspoons (38 g) for males. Foods that contain processed or refined starches and grains. Refined grain products, such as white flour, degermed cornmeal, white bread, and white rice. Shopping Choose nutrient-rich snacks, such as vegetables, whole fruits, and nuts. Avoid high-calorie and high-sugar snacks, such as potato chips, fruit snacks, and candy. Use oil-based dressings and spreads on foods instead of solid fats such as butter, margarine, sour cream, or cream cheese. Limit pre-made sauces, mixes, and "instant" products such as flavored rice, instant noodles, and ready-made pasta. Try more plant-protein sources, such as tofu, tempeh, black beans, edamame, lentils, nuts, and seeds. Explore eating plans such as the Mediterranean diet or vegetarian diet. Try heart-healthy dips made with beans and healthy fats like hummus and guacamole. Vegetables go great with these. Cooking Use oil to saut or stir-fry foods instead of solid fats such as butter, margarine, or lard. Try baking, boiling, grilling, or broiling instead of frying. Remove the fatty part of meats before cooking. Steam vegetables in water or broth. Meal planning  At meals, imagine dividing your plate into fourths: One-half of  your plate is fruits and vegetables. One-fourth of your plate is whole grains. One-fourth of your plate is protein, especially lean meats, poultry, eggs, tofu, beans, or nuts. Include  low-fat dairy as part of your daily diet. Lifestyle Choose healthy options in all settings, including home, work, school, restaurants, or stores. Prepare your food safely: Wash your hands after handling raw meats. Where you prepare food, keep surfaces clean by regularly washing with hot, soapy water. Keep raw meats separate from ready-to-eat foods, such as fruits and vegetables. Cook seafood, meat, poultry, and eggs to the recommended temperature. Get a food thermometer. Store foods at safe temperatures. In general: Keep cold foods at 76F (4.4C) or below. Keep hot foods at 176F (60C) or above. Keep your freezer at [facility] (-17.8C) or below. Foods are not safe to eat if they have been between the temperatures of 40-176F (4.4-60C) for more than 2 hours. What foods should I eat? Fruits Aim to eat 1-2 cups of fresh, canned (in natural juice), or frozen fruits each day. One cup of fruit equals 1 small apple, 1 large banana, 8 large strawberries, 1 cup (237 g) canned fruit,  cup (82 g) dried fruit, or 1 cup (240 mL) 100% juice. Vegetables Aim to eat 2-4 cups of fresh and frozen vegetables each day, including different varieties and colors. One cup of vegetables equals 1 cup (91 g) broccoli or cauliflower florets, 2 medium carrots, 2 cups (150 g) raw, leafy greens, 1 large tomato, 1 large bell pepper, 1 large sweet potato, or 1 medium white potato. Grains Aim to eat 5-10 ounce-equivalents of whole grains each day. Examples of 1 ounce-equivalent of grains include 1 slice of bread, 1 cup (40 g) ready-to-eat cereal, 3 cups (24 g) popcorn, or  cup (93 g) cooked rice. Meats and other proteins Try to eat 5-7 ounce-equivalents of protein each day. Examples of 1 ounce-equivalent of protein include 1 egg,  oz nuts (12 almonds, 24 pistachios, or 7 walnut halves), 1/4 cup (90 g) cooked beans, 6 tablespoons (90 g) hummus or 1 tablespoon (16 g) peanut butter. A cut of meat or fish that is the size of a deck  of cards is about 3-4 ounce-equivalents (85 g). Of the protein you eat each week, try to have at least 8 sounce (227 g) of seafood. This is about 2 servings per week. This includes salmon, trout, herring, sardines, and anchovies. Dairy Aim to eat 3 cup-equivalents of fat-free or low-fat dairy each day. Examples of 1 cup-equivalent of dairy include 1 cup (240 mL) milk, 8 ounces (250 g) yogurt, 1 ounces (44 g) natural cheese, or 1 cup (240 mL) fortified soy milk. Fats and oils Aim for about 5 teaspoons (21 g) of fats and oils per day. Choose monounsaturated fats, such as canola and olive oils, mayonnaise made with olive oil or avocado oil, avocados, peanut butter, and most nuts, or polyunsaturated fats, such as sunflower, corn, and soybean oils, walnuts, pine nuts, sesame seeds, sunflower seeds, and flaxseed. Beverages Aim for 6 eight-ounce glasses of water per day. Limit coffee to 3-5 eight-ounce cups per day. Limit caffeinated beverages that have added calories, such as soda and energy drinks. If you drink alcohol: Limit how much you have to: 0-1 drink a day if you are female. 0-2 drinks a day if you are female. Know how much alcohol is in your drink. In the [place] one drink is one 12 oz bottle of beer (355 mL), one 5 oz glass of wine (  148 mL), or one 1 oz glass of hard liquor (44 mL). Seasoning and other foods Try not to add too much salt to your food. Try using herbs and spices instead of salt. Try not to add sugar to food. This information is based on [place] nutrition guidelines. To learn more, visit [employer]. Exact amounts may vary. You may need different amounts. This information is not intended to replace advice given to you by your health care provider. Make sure you discuss any questions you have with your health care provider. Document Revised: 02/22/2022 Document Reviewed: 02/22/2022 Elsevier Patient Education  2024 [employer].

## 2023-08-22 ENCOUNTER — Encounter: Payer: Self-pay | Admitting: Nurse Practitioner

## 2023-08-22 ENCOUNTER — Ambulatory Visit (INDEPENDENT_AMBULATORY_CARE_PROVIDER_SITE_OTHER): Payer: Medicare (Managed Care) | Admitting: Nurse Practitioner

## 2023-08-22 VITALS — BP 128/78 | HR 76 | Temp 98.1°F | Ht 64.7 in | Wt 177.0 lb

## 2023-08-22 DIAGNOSIS — I10 Essential (primary) hypertension: Secondary | ICD-10-CM | POA: Diagnosis not present

## 2023-08-22 DIAGNOSIS — Z0000 Encounter for general adult medical examination without abnormal findings: Secondary | ICD-10-CM

## 2023-08-22 DIAGNOSIS — F5104 Psychophysiologic insomnia: Secondary | ICD-10-CM

## 2023-08-22 DIAGNOSIS — M85832 Other specified disorders of bone density and structure, left forearm: Secondary | ICD-10-CM

## 2023-08-22 DIAGNOSIS — Z6828 Body mass index (BMI) 28.0-28.9, adult: Secondary | ICD-10-CM

## 2023-08-22 DIAGNOSIS — E559 Vitamin D deficiency, unspecified: Secondary | ICD-10-CM

## 2023-08-22 DIAGNOSIS — E7800 Pure hypercholesterolemia, unspecified: Secondary | ICD-10-CM | POA: Diagnosis not present

## 2023-08-22 DIAGNOSIS — K2100 Gastro-esophageal reflux disease with esophagitis, without bleeding: Secondary | ICD-10-CM | POA: Diagnosis not present

## 2023-08-22 DIAGNOSIS — Z6829 Body mass index (BMI) 29.0-29.9, adult: Secondary | ICD-10-CM

## 2023-08-22 DIAGNOSIS — F419 Anxiety disorder, unspecified: Secondary | ICD-10-CM

## 2023-08-22 DIAGNOSIS — Z87891 Personal history of nicotine dependence: Secondary | ICD-10-CM

## 2023-08-22 MED ORDER — OLMESARTAN MEDOXOMIL 5 MG PO TABS: 5.0000 mg | ORAL_TABLET | Freq: Every day | ORAL | 4 refills | Status: AC

## 2023-08-22 MED ORDER — CITALOPRAM HYDROBROMIDE 20 MG PO TABS: ORAL_TABLET | ORAL | 4 refills | Status: AC

## 2023-08-22 MED ORDER — PANTOPRAZOLE SODIUM 40 MG PO TBEC: 40.0000 mg | DELAYED_RELEASE_TABLET | Freq: Two times a day (BID) | ORAL | 4 refills | Status: AC

## 2023-08-22 NOTE — Assessment & Plan Note (Signed)
Chronic and remains stable.  PHQ9 = 0 and GAD7 = 0.  Will continue Celexa and Buspar to take as needed. She denies SI/HI.  Discussed meditation for anxiety.  Will follow-up on mood in 6 months.

## 2023-08-22 NOTE — Assessment & Plan Note (Signed)
 Noted past labs.  ASCVD 9%.  At this time continue diet and regular activity focus, discussed with patient at length.  Consider statin in future if elevations continue, discussed with her and educated her on findings -- will consider Rosuvastatin if trends up continue.  Lipid panel today.

## 2023-08-22 NOTE — Assessment & Plan Note (Signed)
Chronic, stable.  Continue Seroquel 25 MG at night as needed.  Educated on medication.  Recommend focus on sleep hygiene techniques and educated on these.  Return in 6 months.

## 2023-08-22 NOTE — Assessment & Plan Note (Signed)
 History of surgery for reflux >21 years ago.  Continue Protonix 40 MG daily to help with current symptoms.  Return to GI as needed.  Risks of PPI use were discussed with patient including bone loss, C. Diff diarrhea, pneumonia, infections, CKD, electrolyte abnormalities. Verbalizes understanding and chooses to continue the medication.

## 2023-08-22 NOTE — Progress Notes (Signed)
 BP 128/78 (BP Location: Left Arm)   Pulse 76   Temp 98.1 F (36.7 C) (Oral)   Ht 5' 4.7" (1.643 m)   Wt 177 lb (80.3 kg)   SpO2 98%   BMI 29.73 kg/m    Subjective:    Patient ID: Pamela Barrett, female    DOB: 21-Nov-1955, 68 y.o.   MRN: [id number]  HPI: Pamela Barrett is a 68 y.o. female presenting on 04-Sep-2023 for comprehensive medical examination. Current medical complaints include:none  She currently lives with: self Menopausal Symptoms: no  HYPERTENSION / HYPERLIPIDEMIA Taking Olmesartan daily.  No current statin.  Continues Protonix for GERD, with benefit. Satisfied with current treatment? yes Duration of hypertension: chronic BP monitoring frequency: not checking BP range:  BP medication side effects: no Duration of hyperlipidemia: chronic Cholesterol supplements: fish oil Aspirin: no Recent stressors: no Recurrent headaches: no Visual changes: no Palpitations: no Dyspnea: no Chest pain: no Lower extremity edema: no Dizzy/lightheaded: no  The 10-year ASCVD risk score (Arnett DK, et al., 2019) is: 9%   Values used to calculate the score:     Age: 89 years     Sex: Female     Is Non-Hispanic African American: No     Diabetic: No     Tobacco smoker: No     Systolic Blood Pressure: 128 mmHg     Is BP treated: Yes     HDL Cholesterol: 71 mg/dL     Total Cholesterol: 228 mg/dL  OSTEOPENIA Seen on DEXA in 2022.  Continues Vitamin D daily. Past osteoporosis medications/treatments:  Adequate calcium & vitamin D: yes Weight bearing exercises: yes   ANXIETY/STRESS Takes Celexa 20 MG and Buspar 5 MG as needed (very rarely uses) + Seroquel for sleep as needed.  Quit smoking 7 years ago, smoked for 25 years, 1 PPD. Duration:stable Anxious mood: no Excessive worrying: no Irritability: no  Sweating: occasional Nausea: no Palpitations:no Hyperventilation: no Panic attacks: no Agoraphobia: no  Obscessions/compulsions: no Depressed mood: no     2023/09/04    8:41 AM 05/16/2023    1:08 PM 05/02/2023   10:40 AM 03/21/2023   11:14 AM 03/15/2023    2:39 PM  Depression screen PHQ 2/9  Decreased Interest 0 0 0 0 0  Down, Depressed, Hopeless 0 0 0 0 0  PHQ - 2 Score 0 0 0 0 0  Altered sleeping 0 0 0 0 0  Tired, decreased energy 0 0 0 0 0  Change in appetite 0 0 0 0 0  Feeling bad or failure about yourself  0 0 0 0 0  Trouble concentrating 0 0 0 0 0  Moving slowly or fidgety/restless 0 0 0 0 0  Suicidal thoughts 0 0 0 0 0  PHQ-9 Score 0 0 0 0 0  Difficult doing work/chores Not difficult at all Not difficult at all Not difficult at all Not difficult at all Not difficult at all  Anhedonia: no Weight changes: no Insomnia: none Hypersomnia: no Fatigue/loss of energy: no Feelings of worthlessness: no Feelings of guilt: no Impaired concentration/indecisiveness: no Suicidal ideations: no  Crying spells: no Recent Stressors/Life Changes: no   Relationship problems: no   Family stress: no     Financial stress: no    Job stress: no    Recent death/loss: no    September 04, 2023    8:41 AM 05/16/2023    1:08 PM 05/02/2023   10:41 AM 03/21/2023   11:15 AM  GAD 7 :  Generalized Anxiety Score  Nervous, Anxious, on Edge 0 0 0 0  Control/stop worrying 0 0 0 0  Worry too much - different things 0 0 0 0  Trouble relaxing 0 0 0 0  Restless 0 0 0 0  Easily annoyed or irritable 0 0 0 0  Afraid - awful might happen 0 0 0 0  Total GAD 7 Score 0 0 0 0  Anxiety Difficulty Not difficult at all Not difficult at all Not difficult at all Not difficult at all   Functional Status Survey: Is the patient deaf or have difficulty hearing?: No Does the patient have difficulty seeing, even when wearing glasses/contacts?: No Does the patient have difficulty concentrating, remembering, or making decisions?: No Does the patient have difficulty walking or climbing stairs?: No Does the patient have difficulty dressing or bathing?: No Does the patient have  difficulty doing errands alone such as visiting a doctor's office or shopping?: No      12/13/2022    9:23 AM 02/21/2023    9:15 AM 05/02/2023   10:40 AM 05/16/2023    1:07 PM 08/22/2023    8:41 AM  Fall Risk  Falls in the past year? 0 0 0 0 0  Was there an injury with Fall? 0 0 0 0 0  Fall Risk Category Calculator 0 0 0 0 0  Patient at Risk for Falls Due to No Fall Risks No Fall Risks No Fall Risks No Fall Risks No Fall Risks  Fall risk Follow up Falls prevention discussed;Falls evaluation completed Falls evaluation completed Falls evaluation completed Falls evaluation completed Falls evaluation completed      Past Medical History:  Past Medical History:  Diagnosis Date   Abnormal uterine bleeding (AUB) 05/04/2016   Last Assessment & Plan:   Appears to be AUB-P vs AUB-M; likely cervical polyp visualized on speculum exam and EMB performed  TVUS ordered  My suspicion is that this is most likely a benign process. As she does have a 30 pack year history, I counseled the patient extensively on smoking cessation, as this is her biggest risk factor for chronic disease.   Endometrial Biopsy Procedure Note  Urine p   Anxiety    COVID 06/06/2020   Depression    Esophageal dysphagia    Esophageal dysphagia    GERD (gastroesophageal reflux disease)    Hyperlipidemia    Hypertension    Insomnia    Osteopenia    Vitamin D deficiency    Surgical History:  Past Surgical History:  Procedure Laterality Date   BIOPSY  01/24/2023   Procedure: BIOPSY;  Surgeon: Regis Bill, MD;  Location: [facility];  Service: Endoscopy;;   BREAST EXCISIONAL BIOPSY Left 20+ yrs ago   neg   BREAST SURGERY     COLONOSCOPY WITH ESOPHAGOGASTRODUODENOSCOPY (EGD)     COLONOSCOPY WITH PROPOFOL N/A 10/10/2019   Procedure: COLONOSCOPY WITH PROPOFOL;  Surgeon: Toledo, Boykin Nearing, MD;  Location: [facility];  Service: Gastroenterology;  Laterality: N/A;   COLONOSCOPY WITH PROPOFOL N/A 01/24/2023    Procedure: COLONOSCOPY WITH PROPOFOL;  Surgeon: Regis Bill, MD;  Location: [facility];  Service: Endoscopy;  Laterality: N/A;   ESOPHAGOGASTRIC FUNDOPLICATION  2000   ESOPHAGOGASTRODUODENOSCOPY (EGD) WITH PROPOFOL N/A 01/24/2023   Procedure: ESOPHAGOGASTRODUODENOSCOPY (EGD) WITH PROPOFOL;  Surgeon: Regis Bill, MD;  Location: [facility];  Service: Endoscopy;  Laterality: N/A;   ESOPHAGOGASTRODUODENOSCOPY (EGD) WITH PROPOFOL N/A 05/09/2023   Procedure: ESOPHAGOGASTRODUODENOSCOPY (EGD) WITH PROPOFOL;  Surgeon: Mia Creek,  Rossie Muskrat, MD;  Location: [facility];  Service: Endoscopy;  Laterality: N/A;   SQUAMOUS CELL CARCINOMA EXCISION Right     Medications:  Current Outpatient Medications on File Prior to Visit  Medication Sig   aspirin EC 81 MG tablet Take 81 mg by mouth daily.   busPIRone (BUSPAR) 5 MG tablet Take 1 tablet (5 mg total) by mouth as needed (for anxiety). Take twice daily as needed by mouth for anxiety.   cholecalciferol (VITAMIN D3) 25 MCG (1000 UNIT) tablet Take 1,000 Units by mouth daily.   Omega-3 Fatty Acids (FISH OIL) 1000 MG CAPS Take 1 capsule by mouth daily.   ondansetron (ZOFRAN ODT) 4 MG disintegrating tablet Take 1-2 tablets (4-8 mg total) by mouth every 8 (eight) hours as needed for nausea or vomiting.   QUEtiapine (SEROQUEL) 25 MG tablet TAKE 1 TABLET(25 MG) BY MOUTH AT BEDTIME   No current facility-administered medications on file prior to visit.    Allergies:  Allergies  Allergen Reactions   Prednisone Anxiety    Social History:  Social History   Socioeconomic History   Marital status: Widowed    Spouse name: Not on file   Number of children: Not on file   Years of education: Not on file   Highest education level: Not on file  Occupational History   Not on file  Tobacco Use   Smoking status: Former    Current packs/day: 0.00    Types: Cigarettes    Quit date: 04/07/2017    Years since quitting: 6.3   Smokeless tobacco:  Never  Vaping Use   Vaping status: Every Day  Substance and Sexual Activity   Alcohol use: Yes    Comment: on occasion   Drug use: Never   Sexual activity: Not Currently  Other Topics Concern   Not on file  Social History Narrative   Not on file   Social Drivers of Health   Financial Resource Strain: Low Risk  (12/13/2022)   Overall Financial Resource Strain (CARDIA)    Difficulty of Paying Living Expenses: Not hard at all  Food Insecurity: No Food Insecurity (12/13/2022)   Hunger Vital Sign    Worried About Running Out of Food in the Last Year: Never true    Ran Out of Food in the Last Year: Never true  Transportation Needs: No Transportation Needs (12/13/2022)   PRAPARE - Administrator, Civil Service (Medical): No    Lack of Transportation (Non-Medical): No  Physical Activity: Insufficiently Active (12/13/2022)   Exercise Vital Sign    Days of Exercise per Week: 3 days    Minutes of Exercise per Session: 30 min  Stress: No Stress Concern Present (12/13/2022)   [employer] of Occupational Health - Occupational Stress Questionnaire    Feeling of Stress : Not at all  Social Connections: Moderately Isolated (12/13/2022)   Social Connection and Isolation Panel [NHANES]    Frequency of Communication with Friends and Family: More than three times a week    Frequency of Social Gatherings with Friends and Family: Twice a week    Attends Religious Services: More than 4 times per year    Active Member of [employer] or Organizations: No    Attends Banker Meetings: Never    Marital Status: Widowed  Intimate Partner Violence: Not At Risk (12/13/2022)   Humiliation, Afraid, Rape, and Kick questionnaire    Fear of Current or Ex-Partner: No    Emotionally Abused: No  Physically Abused: No    Sexually Abused: No   Social History   Tobacco Use  Smoking Status Former   Current packs/day: 0.00   Types: Cigarettes   Quit date: 04/07/2017   Years since quitting: 6.3   Smokeless Tobacco Never   Social History   Substance and Sexual Activity  Alcohol Use Yes   Comment: on occasion    Family History:  Family History  Problem Relation Age of Onset   Alzheimer's disease Father    Cancer Sister        colon   Cancer Paternal Grandfather        colon   Skin cancer Brother    Varicose Veins Son    Breast cancer Neg Hx     Past medical history, surgical history, medications, allergies, family history and social history reviewed with patient today and changes made to appropriate areas of the chart.   Review of Systems - negative All other ROS negative except what is listed above and in the HPI.      Objective:    BP 128/78 (BP Location: Left Arm)   Pulse 76   Temp 98.1 F (36.7 C) (Oral)   Ht 5' 4.7" (1.643 m)   Wt 177 lb (80.3 kg)   SpO2 98%   BMI 29.73 kg/m   Wt Readings from Last 3 Encounters:  08/22/23 177 lb (80.3 kg)  05/16/23 171 lb 3.2 oz (77.7 kg)  05/09/23 170 lb (77.1 kg)    Physical Exam Vitals and nursing note reviewed. Exam conducted with a chaperone present.  Constitutional:      General: She is not in acute distress.    Appearance: Normal appearance. She is well-developed and well-groomed. She is not ill-appearing or toxic-appearing.  HENT:     Head: Normocephalic and atraumatic.     Right Ear: Hearing, tympanic membrane, ear canal and external ear normal.     Left Ear: Hearing, tympanic membrane, ear canal and external ear normal.     Nose: Nose normal.     Right Sinus: No maxillary sinus tenderness or frontal sinus tenderness.     Left Sinus: No maxillary sinus tenderness or frontal sinus tenderness.     Mouth/Throat:     Mouth: Mucous membranes are moist.     Pharynx: No pharyngeal swelling, oropharyngeal exudate or posterior oropharyngeal erythema.  Eyes:     General:        Right eye: No discharge.        Left eye: No discharge.     Extraocular Movements: Extraocular movements intact.      Conjunctiva/sclera: Conjunctivae normal.     Pupils: Pupils are equal, round, and reactive to light.  Neck:     Thyroid: No thyromegaly.     Vascular: No carotid bruit.  Cardiovascular:     Rate and Rhythm: Normal rate and regular rhythm.     Heart sounds: Normal heart sounds. No murmur heard.    No gallop.  Pulmonary:     Effort: Pulmonary effort is normal. No accessory muscle usage or respiratory distress.     Breath sounds: Normal breath sounds. No decreased breath sounds, wheezing or rhonchi.  Chest:  Breasts:    Breasts are symmetrical.     Right: Normal.     Left: Normal.  Abdominal:     General: Bowel sounds are normal. There is no distension.     Palpations: Abdomen is soft.     Tenderness: There is no abdominal  tenderness.  Musculoskeletal:        General: Normal range of motion.     Cervical back: Normal range of motion and neck supple.     Right lower leg: No edema.     Left lower leg: No edema.  Lymphadenopathy:     Head:     Right side of head: No submental, submandibular, tonsillar, preauricular or posterior auricular adenopathy.     Left side of head: No submental, submandibular, tonsillar, preauricular or posterior auricular adenopathy.     Cervical: No cervical adenopathy.     Upper Body:     Right upper body: No supraclavicular, axillary or pectoral adenopathy.     Left upper body: No supraclavicular, axillary or pectoral adenopathy.  Skin:    General: Skin is warm and dry.     Capillary Refill: Capillary refill takes less than 2 seconds.  Neurological:     Mental Status: She is alert and oriented to person, place, and time.     Cranial Nerves: Cranial nerves 2-12 are intact.     Coordination: Coordination is intact.     Gait: Gait is intact.     Deep Tendon Reflexes: Reflexes are normal and symmetric.     Reflex Scores:      Brachioradialis reflexes are 2+ on the right side and 2+ on the left side.      Patellar reflexes are 2+ on the right side and 2+  on the left side. Psychiatric:        Attention and Perception: Attention normal.        Mood and Affect: Mood normal.        Speech: Speech normal.        Behavior: Behavior normal. Behavior is cooperative.        Thought Content: Thought content normal.    Assessment & Plan:   Problem List Items Addressed This Visit       Cardiovascular and Mediastinum   Essential hypertension - Primary   Ongoing, stable.  Will continue Olmesartan 5 MG daily, educated her on this medication and use + side effects to monitor for.  Recommend she monitor BP at least a few mornings a week at home and document.  DASH diet at home.  Labs today: CBC, CMP, TSH.      Relevant Medications   olmesartan (BENICAR) 5 MG tablet   Other Relevant Orders   CBC with Differential/Platelet   Comprehensive metabolic panel   TSH     Digestive   Gastroesophageal reflux disease with esophagitis without hemorrhage   History of surgery for reflux >21 years ago.  Continue Protonix 40 MG daily to help with current symptoms.  Return to GI as needed.  Risks of PPI use were discussed with patient including bone loss, C. Diff diarrhea, pneumonia, infections, CKD, electrolyte abnormalities. Verbalizes understanding and chooses to continue the medication.       Relevant Orders   Magnesium     Musculoskeletal and Integument   Osteopenia of left forearm   Chronic, stable.  DEXA noted October 2022 = T-score -2.1.  Continue Vitamin D supplement and adequate Calcium intake daily.  Plan for repeat DEXA in October 2027.      Relevant Orders   VITAMIN D 25 Hydroxy (Vit-D Deficiency, Fractures)     Other   Anxiety   Chronic and remains stable.  PHQ9 = 0 and GAD7 = 0.  Will continue Celexa and Buspar to take as needed. She denies SI/HI.  Discussed meditation for anxiety.  Will follow-up on mood in 6 months.       Relevant Medications   citalopram (CELEXA) 20 MG tablet   BMI 29.0-29.9,adult   Recommended eating smaller high  protein, low fat meals more frequently and exercising 30 mins a day 5 times a week with a goal of 10-15lb weight loss in the next 3 months. Patient voiced their understanding and motivation to adhere to these recommendations.       Elevated low density lipoprotein (LDL) cholesterol level   Noted past labs.  ASCVD 9%.  At this time continue diet and regular activity focus, discussed with patient at length.  Consider statin in future if elevations continue, discussed with her and educated her on findings -- will consider Rosuvastatin if trends up continue.  Lipid panel today.      Relevant Orders   Comprehensive metabolic panel   Lipid Panel w/o Chol/HDL Ratio   Former smoker   Recommend continued cessation.  Smoked 1 PPD for 25 years and quit >6 years ago.      Insomnia   Chronic, stable.  Continue Seroquel 25 MG at night as needed.  Educated on medication.  Recommend focus on sleep hygiene techniques and educated on these.  Return in 6 months.      Vitamin D deficiency   Ongoing.  Continue supplement and recheck Vit D level today.       Relevant Orders   VITAMIN D 25 Hydroxy (Vit-D Deficiency, Fractures)   Other Visit Diagnoses       Encounter for annual physical exam       Annual physical today with labs and health maintenance reviewed, discussed with patient.        Follow up plan: Return in about 6 months (around 02/22/2024) for ANXIETY and Insomnia.   LABORATORY TESTING:  - Pap smear: not applicable > 73 years of age  IMMUNIZATIONS:   - Tdap: Tetanus vaccination status reviewed: last tetanus booster within 10 years. - Influenza: Up to date - Pneumovax: Up To Date - Prevnar: Up To Date - HPV: Not applicable - Zostavax vaccine: will obtain these in April  SCREENING: -Mammogram: Up To Date, last on 09/10/22 - Colonoscopy: Up to date  - Bone Density: Up To Date -- last on 02/14/21 -Hearing Test: Not applicable  -Spirometry: Not applicable   PATIENT COUNSELING:    Advised to take 1 mg of folate supplement per day if capable of pregnancy.   Sexuality: Discussed sexually transmitted diseases, partner selection, use of condoms, avoidance of unintended pregnancy  and contraceptive alternatives.   Advised to avoid cigarette smoking.  I discussed with the patient that most people either abstain from alcohol or drink within safe limits (<=14/week and <=4 drinks/occasion for males, <=7/weeks and <= 3 drinks/occasion for females) and that the risk for alcohol disorders and other health effects rises proportionally with the number of drinks per week and how often a drinker exceeds daily limits.  Discussed cessation/primary prevention of drug use and availability of treatment for abuse.   Diet: Encouraged to adjust caloric intake to maintain  or achieve ideal body weight, to reduce intake of dietary saturated fat and total fat, to limit sodium intake by avoiding high sodium foods and not adding table salt, and to maintain adequate dietary potassium and calcium preferably from fresh fruits, vegetables, and low-fat dairy products.    Stressed the importance of regular exercise  Injury prevention: Discussed safety belts, safety helmets, smoke detector, smoking  near bedding or upholstery.   Dental health: Discussed importance of regular tooth brushing, flossing, and dental visits.    NEXT PREVENTATIVE PHYSICAL DUE IN 1 YEAR. Return in about 6 months (around 02/22/2024) for ANXIETY and Insomnia.

## 2023-08-22 NOTE — Assessment & Plan Note (Signed)
 Recommend continued cessation.  Smoked 1 PPD for 25 years and quit >6 years ago.

## 2023-08-22 NOTE — Assessment & Plan Note (Signed)
Chronic, stable.  DEXA noted October 2022 = T-score -2.1.  Continue Vitamin D supplement and adequate Calcium intake daily.  Plan for repeat DEXA in October 2027.

## 2023-08-22 NOTE — Assessment & Plan Note (Signed)
 Ongoing, stable.  Will continue Olmesartan 5 MG daily, educated her on this medication and use + side effects to monitor for.  Recommend she monitor BP at least a few mornings a week at home and document.  DASH diet at home.  Labs today: CBC, CMP, TSH.

## 2023-08-22 NOTE — Assessment & Plan Note (Signed)
 Recommended eating smaller high protein, low fat meals more frequently and exercising 30 mins a day 5 times a week with a goal of 10-15lb weight loss in the next 3 months. Patient voiced their understanding and motivation to adhere to these recommendations.

## 2023-08-22 NOTE — Assessment & Plan Note (Signed)
Ongoing.  Continue supplement and recheck Vit D level today.  ?

## 2023-08-23 ENCOUNTER — Encounter: Payer: Self-pay | Admitting: Nurse Practitioner

## 2023-08-23 LAB — COMPREHENSIVE METABOLIC PANEL
ALT: 11 IU/L (ref 0–32)
AST: 14 IU/L (ref 0–40)
Albumin: 4.6 g/dL (ref 3.9–4.9)
Alkaline Phosphatase: 99 IU/L (ref 44–121)
BUN/Creatinine Ratio: 16 (ref 12–28)
BUN: 14 mg/dL (ref 8–27)
Bilirubin Total: 0.5 mg/dL (ref 0.0–1.2)
CO2: 23 mmol/L (ref 20–29)
Calcium: 9.3 mg/dL (ref 8.7–10.3)
Chloride: 103 mmol/L (ref 96–106)
Creatinine, Ser: 0.85 mg/dL (ref 0.57–1.00)
Globulin, Total: 2 g/dL (ref 1.5–4.5)
Glucose: 94 mg/dL (ref 70–99)
Potassium: 4.1 mmol/L (ref 3.5–5.2)
Sodium: 140 mmol/L (ref 134–144)
Total Protein: 6.6 g/dL (ref 6.0–8.5)
eGFR: 75 mL/min/{1.73_m2} (ref >59)

## 2023-08-23 LAB — LIPID PANEL W/O CHOL/HDL RATIO
Cholesterol, Total: 234 mg/dL — ABNORMAL HIGH (ref 100–199)
HDL: 74 mg/dL (ref >39)
LDL Chol Calc (NIH): 145 mg/dL — ABNORMAL HIGH (ref 0–99)
Triglycerides: 88 mg/dL (ref 0–149)
VLDL Cholesterol Cal: 15 mg/dL (ref 5–40)

## 2023-08-23 LAB — CBC WITH DIFFERENTIAL/PLATELET
Basophils Absolute: 0 10*3/uL (ref 0.0–0.2)
Basos: 1 %
EOS (ABSOLUTE): 0.1 10*3/uL (ref 0.0–0.4)
Eos: 1 %
Hematocrit: 42.8 % (ref 34.0–46.6)
Hemoglobin: 13.8 g/dL (ref 11.1–15.9)
Immature Grans (Abs): 0 10*3/uL (ref 0.0–0.1)
Immature Granulocytes: 0 %
Lymphocytes Absolute: 1 10*3/uL (ref 0.7–3.1)
Lymphs: 18 %
MCH: 28.6 pg (ref 26.6–33.0)
MCHC: 32.2 g/dL (ref 31.5–35.7)
MCV: 89 fL (ref 79–97)
Monocytes Absolute: 0.4 10*3/uL (ref 0.1–0.9)
Monocytes: 6 %
Neutrophils Absolute: 4.1 10*3/uL (ref 1.4–7.0)
Neutrophils: 74 %
Platelets: 204 10*3/uL (ref 150–450)
RBC: 4.83 x10E6/uL (ref 3.77–5.28)
RDW: 12.1 % (ref 11.7–15.4)
WBC: 5.6 10*3/uL (ref 3.4–10.8)

## 2023-08-23 LAB — VITAMIN D 25 HYDROXY (VIT D DEFICIENCY, FRACTURES): Vit D, 25-Hydroxy: 107 ng/mL — ABNORMAL HIGH (ref 30.0–100.0)

## 2023-08-23 LAB — TSH: TSH: 1.26 u[IU]/mL (ref 0.450–4.500)

## 2023-08-23 LAB — MAGNESIUM: Magnesium: 2 mg/dL (ref 1.6–2.3)

## 2023-08-23 NOTE — Progress Notes (Signed)
 Contacted via MyChart The 10-year ASCVD risk score (Arnett DK, et al., 2019) is: 8.9%   Values used to calculate the score:     Age: 68 years     Sex: Female     Is Non-Hispanic African American: No     Diabetic: No     Tobacco smoker: No     Systolic Blood Pressure: 128 mmHg     Is BP treated: Yes     HDL Cholesterol: 74 mg/dL     Total Cholesterol: 234 mg/dL

## 2023-09-11 ENCOUNTER — Other Ambulatory Visit: Payer: Self-pay | Admitting: Nurse Practitioner

## 2023-10-11 ENCOUNTER — Other Ambulatory Visit: Payer: Self-pay | Admitting: Nurse Practitioner

## 2023-10-12 NOTE — Telephone Encounter (Signed)
 Requested medication (s) are due for refill today: yes  Requested medication (s) are on the active medication list: yes  Last refill:  08/20/22 #90 4 RF  Future visit scheduled: yes  Notes to clinic:  med not delegated to NT to RF   Requested Prescriptions  Pending Prescriptions Disp Refills   QUEtiapine  (SEROQUEL ) 25 MG tablet [Pharmacy Med Name: QUETIAPINE  25MG  TABLETS] 90 tablet 4    Sig: TAKE 1 TABLET(25 MG) BY MOUTH AT BEDTIME     Not Delegated - Psychiatry:  Antipsychotics - Second Generation (Atypical) - quetiapine  Failed - 10/12/2023  2:28 PM      Failed - This refill cannot be delegated      Failed - Lipid Panel in normal range within the last 12 months    Cholesterol, Total  Date Value Ref Range Status  08/22/2023 234 (H) 100 - 199 mg/dL Final   LDL Chol Calc (NIH)  Date Value Ref Range Status  08/22/2023 145 (H) 0 - 99 mg/dL Final   HDL  Date Value Ref Range Status  08/22/2023 74 >39 mg/dL Final   Triglycerides  Date Value Ref Range Status  08/22/2023 88 0 - 149 mg/dL Final         Passed - TSH in normal range and within 360 days    TSH  Date Value Ref Range Status  08/22/2023 1.260 0.450 - 4.500 uIU/mL Final         Passed - Last BP in normal range    BP Readings from Last 1 Encounters:  08/22/23 128/78         Passed - Last Heart Rate in normal range    Pulse Readings from Last 1 Encounters:  08/22/23 76         Passed - Valid encounter within last 6 months    Recent Outpatient Visits           1 month ago Essential hypertension   [facility] [place], [place] T, NP              Passed - CBC within normal limits and completed in the last 12 months    WBC  Date Value Ref Range Status  08/22/2023 5.6 3.4 - 10.8 x10E3/uL Final   RBC  Date Value Ref Range Status  08/22/2023 4.83 3.77 - 5.28 x10E6/uL Final   Hemoglobin  Date Value Ref Range Status  08/22/2023 13.8 11.1 - 15.9 g/dL Final   Hematocrit  Date  Value Ref Range Status  08/22/2023 42.8 34.0 - 46.6 % Final   MCHC  Date Value Ref Range Status  08/22/2023 32.2 31.5 - 35.7 g/dL Final   [facility]  Date Value Ref Range Status  08/22/2023 28.6 26.6 - 33.0 pg Final   MCV  Date Value Ref Range Status  08/22/2023 89 79 - 97 fL Final   No results found for: "PLTCOUNTKUC", "LABPLAT", "POCPLA" RDW  Date Value Ref Range Status  08/22/2023 12.1 11.7 - 15.4 % Final         Passed - CMP within normal limits and completed in the last 12 months    Albumin  Date Value Ref Range Status  08/22/2023 4.6 3.9 - 4.9 g/dL Final   Alkaline Phosphatase  Date Value Ref Range Status  08/22/2023 99 44 - 121 IU/L Final   ALT  Date Value Ref Range Status  08/22/2023 11 0 - 32 IU/L Final   AST  Date Value Ref Range Status  08/22/2023 14 0 - 40 IU/L Final   BUN  Date Value Ref Range Status  08/22/2023 14 8 - 27 mg/dL Final   Calcium  Date Value Ref Range Status  08/22/2023 9.3 8.7 - 10.3 mg/dL Final   CO2  Date Value Ref Range Status  08/22/2023 23 20 - 29 mmol/L Final   Creatinine, Ser  Date Value Ref Range Status  08/22/2023 0.85 0.57 - 1.00 mg/dL Final   Glucose  Date Value Ref Range Status  08/22/2023 94 70 - 99 mg/dL Final   Potassium  Date Value Ref Range Status  08/22/2023 4.1 3.5 - 5.2 mmol/L Final   Sodium  Date Value Ref Range Status  08/22/2023 140 134 - 144 mmol/L Final   Bilirubin Total  Date Value Ref Range Status  08/22/2023 0.5 0.0 - 1.2 mg/dL Final   Total Protein  Date Value Ref Range Status  08/22/2023 6.6 6.0 - 8.5 g/dL Final   GFR calc Af Amer  Date Value Ref Range Status  07/25/2020 99 >59 mL/min/1.73 Final    Comment:    **In accordance with recommendations from the NKF-ASN Task force,**   [facility] is in the process of updating its eGFR calculation to the   2021 CKD-EPI creatinine equation that estimates kidney function   without a race variable.    eGFR  Date Value Ref Range Status   08/22/2023 75 >59 mL/min/1.73 Final   GFR calc non Af Amer  Date Value Ref Range Status  07/25/2020 86 >59 mL/min/1.73 Final

## 2023-11-08 DIAGNOSIS — Z86018 Personal history of other benign neoplasm: Secondary | ICD-10-CM | POA: Diagnosis not present

## 2023-11-08 DIAGNOSIS — L570 Actinic keratosis: Secondary | ICD-10-CM | POA: Diagnosis not present

## 2023-11-08 DIAGNOSIS — Z859 Personal history of malignant neoplasm, unspecified: Secondary | ICD-10-CM | POA: Diagnosis not present

## 2023-11-08 DIAGNOSIS — L578 Other skin changes due to chronic exposure to nonionizing radiation: Secondary | ICD-10-CM | POA: Diagnosis not present

## 2023-11-08 DIAGNOSIS — Z872 Personal history of diseases of the skin and subcutaneous tissue: Secondary | ICD-10-CM | POA: Diagnosis not present

## 2023-12-20 ENCOUNTER — Ambulatory Visit: Payer: Medicare (Managed Care) | Admitting: Emergency Medicine

## 2023-12-20 VITALS — Ht 64.0 in | Wt 175.0 lb

## 2023-12-20 DIAGNOSIS — Z1231 Encounter for screening mammogram for malignant neoplasm of breast: Secondary | ICD-10-CM

## 2023-12-20 DIAGNOSIS — Z0000 Encounter for general adult medical examination without abnormal findings: Secondary | ICD-10-CM | POA: Diagnosis not present

## 2023-12-20 NOTE — Patient Instructions (Signed)
 Pamela Barrett , Thank you for taking time out of your busy schedule to complete your Annual Wellness Visit with me. I enjoyed our conversation and look forward to speaking with you again next year. I, as well as your care team,  appreciate your ongoing commitment to your health goals. Please review the following plan we discussed and let me know if I can assist you in the future. Your Game plan/ To Do List    Referrals: None   Follow up Visits: Next Medicare AWV with our clinical staff: 12/25/24 @ 8:40am (PHONE VISIT)   Have you seen your provider in the last 6 months (3 months if uncontrolled diabetes)? Yes Next Office Visit with your provider: 02/28/24 @ 8:20am with Jolene Cannady, NP  Clinician Recommendations:  Aim for 30 minutes of exercise or brisk walking, 6-8 glasses of water, and 5 servings of fruits and vegetables each day. Get the Shingles vaccine at your local pharmacy. Please call to schedule your mammogram:  [facility] Address: [address], [place], [place] Phone: [phone]  [facility] [address], [address] [place], [place] [address] Phone: [phone]       This is a list of the screening recommended for you and due dates:  Health Maintenance  Topic Date Due   Screening for Lung Cancer  Never done   Zoster (Shingles) Vaccine (1 of 2) Never done   COVID-19 Vaccine (3 - 2024-25 season) 02/06/2023   Mammogram  09/10/2023   Flu Shot  01/06/2024   Medicare Annual Wellness Visit  12/19/2024   DEXA scan (bone density measurement)  03/16/2026   Colon Cancer Screening  01/24/2028   DTaP/Tdap/Td vaccine (3 - Td or Tdap) 04/24/2029   Pneumococcal Vaccine for age over 25  Completed   Hepatitis C Screening  Completed   Hepatitis B Vaccine  Aged Out   HPV Vaccine  Aged Out   Meningitis B Vaccine  Aged Out    Advanced directives: (ACP Link)Information on Advanced Care Planning can be found at  [employer] [facility] Advance Health Care Directives Advance Health Care Directives. [email or web address] You may also get the forms at your doctor's office. Advance Care Planning is important because it:  [x]  Makes sure you receive the medical care that is consistent with your values, goals, and preferences  [x]  It provides guidance to your family and loved ones and reduces their decisional burden about whether or not they are making the right decisions based on your wishes.  Follow the link provided in your after visit summary or read over the paperwork we have mailed to you to help you started getting your Advance Directives in place. If you need assistance in completing these, please reach out to us  so that we can help you!  See attachments for Preventive Care and Fall Prevention Tips.   Fall Prevention in the Home, Adult Falls can cause injuries and affect people of all ages. There are many simple things that you can do to make your home safe and to help prevent falls. If you need it, ask for help making these changes. What actions can I take to prevent falls? General information Use good lighting in all rooms. Make sure to: Replace any light bulbs that burn out. Turn on lights if it is dark and use night-lights. Keep items that you use often in easy-to-reach places. Lower the shelves around your home if needed. Move furniture so that  there are clear paths around it. Do not keep throw rugs or other things on the floor that can make you trip. If any of your floors are uneven, fix them. Add color or contrast paint or tape to clearly mark and help you see: Grab bars or handrails. First and last steps of staircases. Where the edge of each step is. If you use a ladder or stepladder: Make sure that it is fully opened. Do not climb a closed ladder. Make sure the sides of the ladder are locked in place. Have someone hold the ladder while you use it. Know where your pets are as you move  through your home. What can I do in the bathroom?     Keep the floor dry. Clean up any water that is on the floor right away. Remove soap buildup in the bathtub or shower. Buildup makes bathtubs and showers slippery. Use non-skid mats or decals on the floor of the bathtub or shower. Attach bath mats securely with double-sided, non-slip rug tape. If you need to sit down while you are in the shower, use a non-slip stool. Install grab bars by the toilet and in the bathtub and shower. Do not use towel bars as grab bars. What can I do in the bedroom? Make sure that you have a light by your bed that is easy to reach. Do not use any sheets or blankets on your bed that hang to the floor. Have a firm bench or chair with side arms that you can use for support when you get dressed. What can I do in the kitchen? Clean up any spills right away. If you need to reach something above you, use a sturdy step stool that has a grab bar. Keep electrical cables out of the way. Do not use floor polish or wax that makes floors slippery. What can I do with my stairs? Do not leave anything on the stairs. Make sure that you have a light switch at the top and the bottom of the stairs. Have them installed if you do not have them. Make sure that there are handrails on both sides of the stairs. Fix handrails that are broken or loose. Make sure that handrails are as long as the staircases. Install non-slip stair treads on all stairs in your home if they do not have carpet. Avoid having throw rugs at the top or bottom of stairs, or secure the rugs with carpet tape to prevent them from moving. Choose a carpet design that does not hide the edge of steps on the stairs. Make sure that carpet is firmly attached to the stairs. Fix any carpet that is loose or worn. What can I do on the outside of my home? Use bright outdoor lighting. Repair the edges of walkways and driveways and fix any cracks. Clear paths of anything that  can make you trip, such as tools or rocks. Add color or contrast paint or tape to clearly mark and help you see high doorway thresholds. Trim any bushes or trees on the main path into your home. Check that handrails are securely fastened and in good repair. Both sides of all steps should have handrails. Install guardrails along the edges of any raised decks or porches. Have leaves, snow, and ice cleared regularly. Use sand, salt, or ice melt on walkways during winter months if you live where there is ice and snow. In the garage, clean up any spills right away, including grease or oil spills. What other actions  can I take? Review your medicines with your health care provider. Some medicines can make you confused or feel dizzy. This can increase your chance of falling. Wear closed-toe shoes that fit well and support your feet. Wear shoes that have rubber soles and low heels. Use a cane, walker, scooter, or crutches that help you move around if needed. Talk with your provider about other ways that you can decrease your risk of falls. This may include seeing a physical therapist to learn to do exercises to improve movement and strength. Where to find more information Centers for Disease Control and Prevention, STEADI: TonerPromos.no [employer] on Aging: BaseRingTones.pl National Institute on Aging: BaseRingTones.pl Contact a health care provider if: You are afraid of falling at home. You feel weak, drowsy, or dizzy at home. You fall at home. Get help right away if you: Lose consciousness or have trouble moving after a fall. Have a fall that causes a head injury. These symptoms may be an emergency. Get help right away. Call 911. Do not wait to see if the symptoms will go away. Do not drive yourself to the hospital. This information is not intended to replace advice given to you by your health care provider. Make sure you discuss any questions you have with your health care provider. Document Revised:  01/25/2022 Document Reviewed: 01/25/2022 Elsevier Patient Education  2024 [employer].

## 2023-12-20 NOTE — Progress Notes (Signed)
 Subjective:   Pamela Barrett is a 68 y.o. who presents for a Medicare Wellness preventive visit.  As a reminder, Annual Wellness Visits don't include a physical exam, and some assessments may be limited, especially if this visit is performed virtually. We may recommend an in-person follow-up visit with your provider if needed.  Visit Complete: Virtual I connected with  [name] on 12/20/23 by a audio enabled telemedicine application and verified that I am speaking with the correct person using two identifiers.  Patient Location: Home  Provider Location: Office/Clinic  I discussed the limitations of evaluation and management by telemedicine. The patient expressed understanding and agreed to proceed.  Vital Signs: Because this visit was a virtual/telehealth visit, some criteria may be missing or patient reported. Any vitals not documented were not able to be obtained and vitals that have been documented are patient reported.  VideoDeclined- This patient declined Librarian, academic. Therefore the visit was completed with audio only.  Persons Participating in Visit: Patient.  AWV Questionnaire: No: Patient Medicare AWV questionnaire was not completed prior to this visit.  Cardiac Risk Factors include: advanced age (>1men, >55 women);hypertension;obesity (BMI >30kg/m2)     Objective:    Today's Vitals   12/20/23 0837  Weight: 175 lb (79.4 kg)  Height: 5' 4 (1.626 m)   Body mass index is 30.04 kg/m.     12/20/2023    8:48 AM 05/09/2023    8:32 AM 01/24/2023    9:58 AM 12/13/2022    9:23 AM 12/07/2021    9:08 AM 10/10/2019   10:22 AM  Advanced Directives  Does Patient Have a Medical Advance Directive? No No No No No No  Would patient like information on creating a medical advance directive? Yes (MAU/Ambulatory/Procedural Areas - Information given)   No - Patient declined No - Patient declined No - Patient declined    Current Medications  (verified) Outpatient Encounter Medications as of 12/20/2023  Medication Sig   aspirin EC 81 MG tablet Take 81 mg by mouth daily.   busPIRone  (BUSPAR ) 5 MG tablet Take 1 tablet (5 mg total) by mouth as needed (for anxiety). Take twice daily as needed by mouth for anxiety.   cholecalciferol  (VITAMIN D3) 25 MCG (1000 UNIT) tablet Take 1,000 Units by mouth daily.   citalopram  (CELEXA ) 20 MG tablet TAKE 1 TABLET(20 MG) BY MOUTH DAILY   olmesartan  (BENICAR ) 5 MG tablet Take 1 tablet (5 mg total) by mouth daily.   Omega-3 Fatty Acids (FISH OIL) 1000 MG CAPS Take 1 capsule by mouth daily.   ondansetron  (ZOFRAN  ODT) 4 MG disintegrating tablet Take 1-2 tablets (4-8 mg total) by mouth every 8 (eight) hours as needed for nausea or vomiting.   pantoprazole  (PROTONIX ) 40 MG tablet Take 1 tablet (40 mg total) by mouth 2 (two) times daily.   QUEtiapine  (SEROQUEL ) 25 MG tablet TAKE 1 TABLET(25 MG) BY MOUTH AT BEDTIME   No facility-administered encounter medications on file as of 12/20/2023.    Allergies (verified) Prednisone    History: Past Medical History:  Diagnosis Date   Abnormal uterine bleeding (AUB) 05/04/2016   Last Assessment & Plan:   Appears to be AUB-P vs AUB-M; likely cervical polyp visualized on speculum exam and EMB performed  TVUS ordered  My suspicion is that this is most likely a benign process. As she does have a 30 pack year history, I counseled the patient extensively on smoking cessation, as this is her biggest risk factor  for chronic disease.   Endometrial Biopsy Procedure Note  Urine p   Anxiety    COVID 06/06/2020   Depression    Esophageal dysphagia    Esophageal dysphagia    GERD (gastroesophageal reflux disease)    Hyperlipidemia    Hypertension    Insomnia    Osteopenia    Vitamin D  deficiency    Past Surgical History:  Procedure Laterality Date   BIOPSY  01/24/2023   Procedure: BIOPSY;  Surgeon: Maryruth Ole DASEN, MD;  Location: [facility] ENDOSCOPY;  Service:  Endoscopy;;   BREAST EXCISIONAL BIOPSY Left 20+ yrs ago   neg   BREAST SURGERY     COLONOSCOPY WITH ESOPHAGOGASTRODUODENOSCOPY (EGD)     COLONOSCOPY WITH PROPOFOL  N/A 10/10/2019   Procedure: COLONOSCOPY WITH PROPOFOL ;  Surgeon: Toledo, Ladell POUR, MD;  Location: [facility] END[facility]COPY;  Service: Gastroenterology;  Laterality: N/A;   COLONOSCOPY WITH PROPOFOL  N/A 01/24/2023   Procedure: COLONOSCOPY WITH PROPOFOL ;  Surgeon: Maryruth Ole DASEN, MD;  Location: [facility]COPY;  Service: Endoscopy;  Laterality: N/A;   ESOPHAGOGASTRIC FUNDOPLICATION  2000   ESOPHAGOGASTRODUODENOSCOPY (EGD) WITH PROPOFOL  N/A 01/24/2023   Procedure: ESOPHAGOGASTRODUODENOSCOPY (EGD) WITH PROPOFOL ;  Surgeon: Maryruth Ole DASEN, MD;  Location: [facility];  Service: Endoscopy;  Laterality: N/A;   ESOPHAGOGASTRODUODENOSCOPY (EGD) WITH PROPOFOL  N/A 05/09/2023   Procedure: ESOPHAGOGASTRODUODENOSCOPY (EGD) WITH PROPOFOL ;  Surgeon: Maryruth Ole DASEN, MD;  Location: [facility];  Service: Endoscopy;  Laterality: N/A;   SQUAMOUS CELL CARCINOMA EXCISION Right    Family History  Problem Relation Age of Onset   Hyperlipidemia Mother    Hypertension Mother    Alzheimer's disease Father    Cancer Sister        colon   Skin cancer Brother    Varicose Veins Son    Cancer Paternal Grandfather        colon   Breast cancer Neg Hx    Social History   Socioeconomic History   Marital status: Widowed    Spouse name: Not on file   Number of children: 3   Years of education: Not on file   Highest education level: Not on file  Occupational History   Not on file  Tobacco Use   Smoking status: Former    Current packs/day: 0.00    Average packs/day: 1 pack/day for 40.8 years (40.8 ttl pk-yrs)    Types: Cigarettes    Start date: 85    Quit date: 04/07/2017    Years since quitting: 6.7   Smokeless tobacco: Never  Vaping Use   Vaping status: Every Day   Substances: Nicotine, Flavoring  Substance and Sexual Activity    Alcohol use: Yes    Comment: on occasion, 1-2 cocktails monthly or less   Drug use: Never   Sexual activity: Not Currently  Other Topics Concern   Not on file  Social History Narrative   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   Social Drivers of Health   Financial Resource Strain: Low Risk  (12/20/2023)   Overall Financial Resource Strain (CARDIA)    Difficulty of Paying Living Expenses: Not hard at all  Food Insecurity: No Food Insecurity (12/20/2023)   Hunger Vital Sign    Worried About Running Out of Food in the Last Year: Never true    Ran Out of Food in the Last Year: Never true  Transportation Needs: No Transportation Needs (12/20/2023)   PRAPARE - Transportation    Lack of Transportation (Medical): No    Lack of  Transportation (Non-Medical): No  Physical Activity: Inactive (12/20/2023)   Exercise Vital Sign    Days of Exercise per Week: 0 days    Minutes of Exercise per Session: 0 min  Stress: No Stress Concern Present (12/20/2023)   [employer] of Occupational Health - Occupational Stress Questionnaire    Feeling of Stress: Not at all  Social Connections: Moderately Isolated (12/20/2023)   Social Connection and Isolation Panel    Frequency of Communication with Friends and Family: More than three times a week    Frequency of Social Gatherings with Friends and Family: More than three times a week    Attends Religious Services: More than 4 times per year    Active Member of [employer] or Organizations: No    Attends Banker Meetings: Never    Marital Status: Widowed    Tobacco Counseling Counseling given: Not Answered    Clinical Intake:  Pre-visit preparation completed: Yes  Pain : No/denies pain     BMI - recorded: 30.04 Nutritional Status: BMI > 30  Obese Nutritional Risks: None Diabetes: No  No results found for: HGBA1C   How often do you need to have someone help you when you read instructions, pamphlets, or other written  materials from your doctor or pharmacy?: 1 - Never  Interpreter Needed?: No  Information entered by :: Vina Ned, CMA   Activities of Daily Living     12/20/2023    8:39 AM 08/22/2023    8:40 AM  In your present state of health, do you have any difficulty performing the following activities:  Hearing? 0 0  Vision? 0 0  Difficulty concentrating or making decisions? 0 0  Walking or climbing stairs? 0 0  Dressing or bathing? 0 0  Doing errands, shopping? 0 0  Preparing Food and eating ? N   Using the Toilet? N   In the past six months, have you accidently leaked urine? N   Do you have problems with loss of bowel control? N   Managing your Medications? N   Managing your Finances? N   Housekeeping or managing your Housekeeping? N     Patient Care Team: Cannady, Jolene T, NP as PCP - General (Nurse Practitioner) Cathlyn Seal, MD as Referring Physician (Dermatology) Maryruth Ole DASEN, MD as Consulting Physician (Gastroenterology)  I have updated your Care Teams any recent Medical Services you may have received from other providers in the past year.     Assessment:   This is a routine wellness examination for Nakyiah.  Hearing/Vision screen Hearing Screening - Comments:: Denies hearing loss  Vision Screening - Comments:: Gets routine eye exams, [facility] [place]   Goals Addressed               This Visit's Progress     Weight (lb) < 140 lb (63.5 kg) (pt-stated)   175 lb (79.4 kg)      Depression Screen     12/20/2023    8:46 AM 08/22/2023    8:41 AM 05/16/2023    1:08 PM 05/02/2023   10:40 AM 03/21/2023   11:14 AM 03/15/2023    2:39 PM 02/21/2023    9:15 AM  PHQ 2/9 Scores  PHQ - 2 Score 0 0 0 0 0 0 0  PHQ- 9 Score 0 0 0 0 0 0 0    Fall Risk     12/20/2023    8:50 AM 08/22/2023    8:41 AM 05/16/2023    1:07  PM 05/02/2023   10:40 AM 02/21/2023    9:15 AM  Fall Risk   Falls in the past year? 0 0 0 0 0  Number falls in past yr: 0 0 0 0 0  Injury  with Fall? 0 0 0 0 0  Risk for fall due to : No Fall Risks No Fall Risks No Fall Risks No Fall Risks No Fall Risks  Follow up Falls evaluation completed Falls evaluation completed Falls evaluation completed Falls evaluation completed Falls evaluation completed    MEDICARE RISK AT HOME:  Medicare Risk at Home Any stairs in or around the home?: Yes If so, are there any without handrails?: No Home free of loose throw rugs in walkways, pet beds, electrical cords, etc?: Yes Adequate lighting in your home to reduce risk of falls?: Yes Life alert?: No Use of a cane, walker or w/c?: No Grab bars in the bathroom?: No Shower chair or bench in shower?: No Elevated toilet seat or a handicapped toilet?: No  TIMED UP AND GO:  Was the test performed?  No  Cognitive Function: 6CIT completed        12/20/2023    8:51 AM 12/13/2022    9:24 AM 12/07/2021    9:06 AM  6CIT Screen  What Year? 0 points 0 points 0 points  What month? 0 points 0 points 0 points  What time? 0 points 0 points 0 points  Count back from 20 0 points 0 points 0 points  Months in reverse 0 points 0 points 0 points  Repeat phrase 0 points 0 points 0 points  Total Score 0 points 0 points 0 points    Immunizations Immunization History  Administered Date(s) Administered   Fluad Quad(high Dose 65+) 02/16/2021   Fluad Trivalent(High Dose 65+) 02/21/2023   Influenza,inj,Quad PF,6+ Mos 04/03/2018, 04/25/2019, 04/18/2020   Influenza,inj,quad, With Preservative 03/18/2016, 04/12/2017   Influenza-Unspecified 04/25/2019   PFIZER(Purple Top)SARS-COV-2 Vaccination 07/19/2019, 08/16/2019   PNEUMOCOCCAL CONJUGATE-20 02/19/2022   Pneumococcal Conjugate-13 02/16/2021   Tdap 01/11/2017, 04/25/2019    Screening Tests Health Maintenance  Topic Date Due   Lung Cancer Screening  Never done   Zoster Vaccines- Shingrix  (1 of 2) Never done   COVID-19 Vaccine (3 - 2024-25 season) 02/06/2023   MAMMOGRAM  09/10/2023   INFLUENZA VACCINE   01/06/2024   Medicare Annual Wellness (AWV)  12/19/2024   DEXA SCAN  03/16/2026   Colonoscopy  01/24/2028   DTaP/Tdap/Td (3 - Td or Tdap) 04/24/2029   Pneumococcal Vaccine: 50+ Years  Completed   Hepatitis C Screening  Completed   Hepatitis B Vaccines  Aged Out   HPV VACCINES  Aged Out   Meningococcal B Vaccine  Aged Out    Health Maintenance  Health Maintenance Due  Topic Date Due   Lung Cancer Screening  Never done   Zoster Vaccines- Shingrix  (1 of 2) Never done   COVID-19 Vaccine (3 - 2024-25 season) 02/06/2023   MAMMOGRAM  09/10/2023   Health Maintenance Items Addressed: Mammogram ordered, See Nurse Notes at the end of this note  Additional Screening:  Vision Screening: Recommended annual ophthalmology exams for early detection of glaucoma and other disorders of the eye. Would you like a referral to an eye doctor? No    Dental Screening: Recommended annual dental exams for proper oral hygiene  Community Resource Referral / Chronic Care Management: CRR required this visit?  No   CCM required this visit?  No   Plan:    I  have personally reviewed and noted the following in the patient's chart:   Medical and social history Use of alcohol, tobacco or illicit drugs  Current medications and supplements including opioid prescriptions. Patient is not currently taking opioid prescriptions. Functional ability and status Nutritional status Physical activity Advanced directives List of other physicians Hospitalizations, surgeries, and ER visits in previous 12 months Vitals Screenings to include cognitive, depression, and falls Referrals and appointments  In addition, I have reviewed and discussed with patient certain preventive protocols, quality metrics, and best practice recommendations. A written personalized care plan for preventive services as well as general preventive health recommendations were provided to patient.   Vina Ned, CMA   12/20/2023   After  Visit Summary: (MyChart) Due to this being a telephonic visit, the after visit summary with patients personalized plan was offered to patient via MyChart   Notes:  Placed order for a MMG (due 09/10/23) Needs Shingrix  vaccines (pharmacy) Declined lung cancer screening (LDCT) Declined Covid vaccines

## 2024-02-26 NOTE — Patient Instructions (Signed)
 Please call to schedule your mammogram and/or bone density: [facility] at [facility]  Address: [address], [place], [place] [address] Phone: [phone]  [facility] at [facility] [address] [place],  [place]  [address] Phone: [phone]   Managing Anxiety, Adult After being diagnosed with anxiety, you may be relieved to know why you have felt or behaved a certain way. You may also feel overwhelmed about the treatment ahead and what it will mean for your life. With care and support, you can manage your anxiety. How to manage lifestyle changes Understanding the difference between stress and anxiety Although stress can play a role in anxiety, it is not the same as anxiety. Stress is your body's reaction to life changes and events, both good and bad. Stress is often caused by something external, such as a deadline, test, or competition. It normally goes away after the event has ended and will last just a few hours. But, stress can be ongoing and can lead to more than just stress. Anxiety is caused by something internal, such as imagining a terrible outcome or worrying that something will go wrong that will greatly upset you. Anxiety often does not go away even after the event is over, and it can become a long-term (chronic) worry. Lowering stress and anxiety Talk with your health care provider or a counselor to learn more about lowering anxiety and stress. They may suggest tension-reduction techniques, such as: Music. Spend time creating or listening to music that you enjoy and that inspires you. Mindfulness-based meditation. Practice being aware of your normal breaths while not trying to control your breathing. It can be done while sitting or walking. Centering prayer. Focus on a word, phrase, or sacred image that means something to you and brings you peace. Deep breathing. Expand your stomach and inhale slowly through your nose. Hold  your breath for 3-5 seconds. Then breathe out slowly, letting your stomach muscles relax. Self-talk. Learn to notice and spot thought patterns that lead to anxiety reactions. Change those patterns to thoughts that feel peaceful. Muscle relaxation. Take time to tense muscles and then relax them. Choose a tension-reduction technique that fits your lifestyle and personality. These techniques take time and practice. Set aside 5-15 minutes a day to do them. Specialized therapists can offer counseling and training in these techniques. The training to help with anxiety may be covered by some insurance plans. Other things you can do to manage stress and anxiety include: Keeping a stress diary. This can help you learn what triggers your reaction and then learn ways to manage your response. Thinking about how you react to certain situations. You may not be able to control everything, but you can control your response. Making time for activities that help you relax and not feeling guilty about spending your time in this way. Doing visual imagery. This involves imagining or creating mental pictures to help you relax. Practicing yoga. Through yoga poses, you can lower tension and relax.  Medicines Medicines for anxiety include: Antidepressant medicines. These are usually prescribed for long-term daily control. Anti-anxiety medicines. These may be added in severe cases, especially when panic attacks occur. When used together, medicines, psychotherapy, and tension-reduction techniques may be the most effective treatment. Relationships Relationships can play a big part in helping you recover. Spend more time connecting with trusted friends and family members. Think about going to couples counseling if you have a partner, taking family education classes, or going  to family therapy. Therapy can help you and others better understand your anxiety. How to recognize changes in your anxiety Everyone responds differently  to treatment for anxiety. Recovery from anxiety happens when symptoms lessen and stop interfering with your daily life at home or work. This may mean that you will start to: Have better concentration and focus. Worry will interfere less in your daily thinking. Sleep better. Be less irritable. Have more energy. Have improved memory. Try to recognize when your condition is getting worse. Contact your provider if your symptoms interfere with home or work and you feel like your condition is not improving. Follow these instructions at home: Activity Exercise. Adults should: Exercise for at least 150 minutes each week. The exercise should increase your heart rate and make you sweat (moderate-intensity exercise). Do strengthening exercises at least twice a week. Get the right amount and quality of sleep. Most adults need 7-9 hours of sleep each night. Lifestyle  Eat a healthy diet that includes plenty of vegetables, fruits, whole grains, low-fat dairy products, and lean protein. Do not eat a lot of foods that are high in fats, added sugars, or salt (sodium). Make choices that simplify your life. Do not use any products that contain nicotine or tobacco. These products include cigarettes, chewing tobacco, and vaping devices, such as e-cigarettes. If you need help quitting, ask your provider. Avoid caffeine, alcohol, and certain over-the-counter cold medicines. These may make you feel worse. Ask your pharmacist which medicines to avoid. General instructions Take over-the-counter and prescription medicines only as told by your provider. Keep all follow-up visits. This is to make sure you are managing your anxiety well or if you need more support. Where to find support You can get help and support from: Self-help groups. Online and [employer]. A trusted spiritual leader. Couples counseling. Family education classes. Family therapy. Where to find more information You may find that  joining a support group helps you deal with your anxiety. The following sources can help you find counselors or support groups near you: Mental Health America: mentalhealthamerica.net Anxiety and Depression Association of [place] (ADAA): adaa.org The [employer] on Mental Illness (NAMI): nami.org Contact a health care provider if: You have a hard time staying focused or finishing tasks. You spend many hours a day feeling worried about everyday life. You are very tired because you cannot stop worrying. You start to have headaches or often feel tense. You have chronic nausea or diarrhea. Get help right away if: Your heart feels like it is racing. You have shortness of breath. You have thoughts of hurting yourself or others. Get help right away if you feel like you may hurt yourself or others, or have thoughts about taking your own life. Go to your nearest emergency room or: Call 911. Call the National Suicide Prevention Lifeline at 762-379-3482 or 988. This is open 24 hours a day. Text the Crisis Text Line at 754 537 5861. This information is not intended to replace advice given to you by your health care provider. Make sure you discuss any questions you have with your health care provider. Document Revised: 03/02/2022 Document Reviewed: 09/14/2020 Elsevier Patient Education  2024 [employer].

## 2024-02-28 ENCOUNTER — Ambulatory Visit (INDEPENDENT_AMBULATORY_CARE_PROVIDER_SITE_OTHER): Payer: Medicare (Managed Care) | Admitting: Nurse Practitioner

## 2024-02-28 ENCOUNTER — Encounter: Payer: Self-pay | Admitting: Nurse Practitioner

## 2024-02-28 VITALS — BP 94/65 | HR 88 | Temp 97.9°F | Resp 15 | Ht 64.02 in | Wt 178.2 lb

## 2024-02-28 DIAGNOSIS — I10 Essential (primary) hypertension: Secondary | ICD-10-CM | POA: Diagnosis not present

## 2024-02-28 DIAGNOSIS — K625 Hemorrhage of anus and rectum: Secondary | ICD-10-CM | POA: Diagnosis not present

## 2024-02-28 DIAGNOSIS — Z23 Encounter for immunization: Secondary | ICD-10-CM | POA: Diagnosis not present

## 2024-02-28 DIAGNOSIS — Z6829 Body mass index (BMI) 29.0-29.9, adult: Secondary | ICD-10-CM

## 2024-02-28 DIAGNOSIS — F5104 Psychophysiologic insomnia: Secondary | ICD-10-CM | POA: Diagnosis not present

## 2024-02-28 DIAGNOSIS — E7800 Pure hypercholesterolemia, unspecified: Secondary | ICD-10-CM

## 2024-02-28 DIAGNOSIS — F419 Anxiety disorder, unspecified: Secondary | ICD-10-CM

## 2024-02-28 MED ORDER — HYDROCORTISONE ACETATE 1 % EX OINT: 1.0000 | TOPICAL_OINTMENT | Freq: Two times a day (BID) | CUTANEOUS | 0 refills | Status: AC | PRN

## 2024-02-28 MED ORDER — HYDROCORTISONE ACETATE 25 MG RE SUPP: 25.0000 mg | Freq: Two times a day (BID) | RECTAL | 0 refills | Status: AC | PRN

## 2024-02-28 NOTE — Progress Notes (Signed)
 BP 94/65 (BP Location: Left Arm, Patient Position: Sitting, Cuff Size: Normal)   Pulse 88   Temp 97.9 F (36.6 C) (Oral)   Resp 15   Ht 5' 4.02 (1.626 m)   Wt 178 lb 3.2 oz (80.8 kg)   SpO2 97%   BMI 30.57 kg/m    Subjective:    Patient ID: Pamela Barrett, female    DOB: 07/29/1955, 68 y.o.   MRN: [id number]  HPI: Pamela Barrett is a 68 y.o. female  Chief Complaint  Patient presents with   Anxiety    No concerns right now, doing great.    Insomnia    Doing good as well.    HYPERTENSION without Chronic Kidney Disease Taking Olmesartan  5 MG daily. Hypertension status: stable  Satisfied with current treatment? yes Duration of hypertension: chronic BP monitoring frequency:  not checking BP range:  BP medication side effects:  no Medication compliance: good compliance Aspirin: no Recurrent headaches: no Visual changes: no Palpitations: no Dyspnea: no Chest pain: no Lower extremity edema: no Dizzy/lightheaded: no  The 10-year ASCVD risk score (Arnett DK, et al., 2019) is: 5.5%   Values used to calculate the score:     Age: 31 years     Clincally relevant sex: Female     Is Non-Hispanic African American: No     Diabetic: No     Tobacco smoker: No     Systolic Blood Pressure: 94 mmHg     Is BP treated: Yes     HDL Cholesterol: 74 mg/dL     Total Cholesterol: 234 mg/dL   ANXIETY/STRESS Continues Celexa  20 MG and Buspar  as needed, rarely uses. Seroquel  at night on occasion for sleep, takes maybe twice a week. Duration:stable Anxious mood: no  Excessive worrying: no Irritability: no  Sweating: no Nausea: no Palpitations:no Hyperventilation: no Panic attacks: no Agoraphobia: no  Obscessions/compulsions: no Depressed mood: no    03/12/24    8:24 AM 12/20/2023    8:46 AM 08/22/2023    8:41 AM 05/16/2023    1:08 PM 05/02/2023   10:40 AM  Depression screen PHQ 2/9  Decreased Interest 0 0 0 0 0  Down, Depressed, Hopeless 0 0 0 0 0  PHQ - 2 Score 0  0 0 0 0  Altered sleeping 0 0 0 0 0  Tired, decreased energy 0 0 0 0 0  Change in appetite 0 0 0 0 0  Feeling bad or failure about yourself  0 0 0 0 0  Trouble concentrating 0 0 0 0 0  Moving slowly or fidgety/restless 0 0 0 0 0  Suicidal thoughts 0 0 0 0 0  PHQ-9 Score 0 0 0 0 0  Difficult doing work/chores  Not difficult at all Not difficult at all Not difficult at all Not difficult at all  Anhedonia: no Weight changes: no Insomnia: yes hard to fall asleep  Hypersomnia: no Fatigue/loss of energy: no Feelings of worthlessness: no Feelings of guilt: no Impaired concentration/indecisiveness: no Suicidal ideations: no  Crying spells: no Recent Stressors/Life Changes: no   Relationship problems: no   Family stress: no     Financial stress: no    Job stress: no    Recent death/loss: no    03-12-2024    8:25 AM 08/22/2023    8:41 AM 05/16/2023    1:08 PM 05/02/2023   10:41 AM  GAD 7 : Generalized Anxiety Score  Nervous, Anxious, on Edge 0 0 0 0  Control/stop worrying 0 0 0 0  Worry too much - different things 0 0 0 0  Trouble relaxing 0 0 0 0  Restless 0 0 0 0  Easily annoyed or irritable 0 0 0 0  Afraid - awful might happen 0 0 0 0  Total GAD 7 Score 0 0 0 0  Anxiety Difficulty  Not difficult at all Not difficult at all Not difficult at all   RECTAL BLEEDING Has had some for a couple weeks.  Known internal hemorrhoids on past colonoscopy. Has BM 1-2 times daily.  No straining. Duration: chronic Bright red rectal bleeding: yes  Amount of blood: varies -- spotting to moderate amount Frequency: every time she has BM Melena: no  Spotting on toilet tissue: yes  Anal fullness: yes  Perianal pain: no  Severity:  Perianal irritation/itching: no  Constipation: no  Chronic straining/valsava:  no  Anal trauma/intercourse: no  Hemorrhoids: yes  Previous colonoscopy: yes   Relevant past medical, surgical, family and social history reviewed and updated as indicated. Interim  medical history since our last visit reviewed. Allergies and medications reviewed and updated.  Review of Systems  Constitutional:  Negative for activity change, appetite change, diaphoresis, fatigue and fever.  Respiratory:  Negative for cough, chest tightness and shortness of breath.   Cardiovascular:  Negative for chest pain, palpitations and leg swelling.  Gastrointestinal: Negative.   Neurological: Negative.   Psychiatric/Behavioral:  Negative for decreased concentration, self-injury, sleep disturbance and suicidal ideas. The patient is not nervous/anxious.     Per HPI unless specifically indicated above     Objective:    BP 94/65 (BP Location: Left Arm, Patient Position: Sitting, Cuff Size: Normal)   Pulse 88   Temp 97.9 F (36.6 C) (Oral)   Resp 15   Ht 5' 4.02 (1.626 m)   Wt 178 lb 3.2 oz (80.8 kg)   SpO2 97%   BMI 30.57 kg/m   Wt Readings from Last 3 Encounters:  02/28/24 178 lb 3.2 oz (80.8 kg)  12/20/23 175 lb (79.4 kg)  08/22/23 177 lb (80.3 kg)    Physical Exam Vitals and nursing note reviewed. Exam conducted with a chaperone present.  Constitutional:      General: She is awake. She is not in acute distress.    Appearance: She is well-developed and well-groomed. She is not ill-appearing.  HENT:     Head: Normocephalic.     Right Ear: Hearing normal.     Left Ear: Hearing normal.  Eyes:     General: Lids are normal.        Right eye: No discharge.        Left eye: No discharge.     Conjunctiva/sclera: Conjunctivae normal.     Pupils: Pupils are equal, round, and reactive to light.  Neck:     Vascular: No carotid bruit.  Cardiovascular:     Rate and Rhythm: Normal rate and regular rhythm.     Heart sounds: Normal heart sounds. No murmur heard.    No gallop.  Pulmonary:     Effort: Pulmonary effort is normal. No accessory muscle usage or respiratory distress.     Breath sounds: Normal breath sounds.  Abdominal:     General: Bowel sounds are normal.      Palpations: Abdomen is soft.  Genitourinary:    Rectum: External hemorrhoid and internal hemorrhoid present. No mass or tenderness.     Comments: No blood noted on glove with exam. Musculoskeletal:  Cervical back: Normal range of motion and neck supple.     Right lower leg: No edema.     Left lower leg: No edema.  Skin:    General: Skin is warm and dry.  Neurological:     Mental Status: She is alert and oriented to person, place, and time.  Psychiatric:        Attention and Perception: Attention normal.        Mood and Affect: Mood normal.        Speech: Speech normal.        Behavior: Behavior normal. Behavior is cooperative.        Thought Content: Thought content normal.      Assessment & Plan:   Problem List Items Addressed This Visit       Cardiovascular and Mediastinum   Essential hypertension - Primary   Ongoing, stable. BP well below goal.  Recommend she check BP at home daily and if levels are similar to today then hold Olmesartan  and monitor, if levels increase consistently to >130/80 then restart. Recommend she monitor BP at least a few mornings a week at home and document.  DASH diet at home.  Labs today: BMP.      Relevant Orders   Basic metabolic panel with GFR     Digestive   Rectal bleeding   Suspect related to hemorrhoids, both internal and external. One external hemorrhoid slightly inflamed. Will start Anusol  cream and suppositories as needed.  Plan for return in 2 weeks, sooner if any worsening, then determine if need to check blood work (CBC, iron) and send to GI.  Recommend she hold ASA until bleeding improved.        Other   Insomnia   Chronic, stable.  Continue Seroquel  25 MG at night as needed.  Educated on medication.  Recommend focus on sleep hygiene techniques and educated on these.  Return in 6 months.      Elevated low density lipoprotein (LDL) cholesterol level   Noted past labs.  ASCVD 5.5%.  At this time continue diet and regular  activity focus, discussed with patient at length.  Consider statin in future if elevations continue, discussed with her and educated her on findings -- will consider Rosuvastatin if trends up continue.  Lipid panel today.      Relevant Orders   Lipid Panel w/o Chol/HDL Ratio   BMI 29.0-29.9,adult   Recommended eating smaller high protein, low fat meals more frequently and exercising 30 mins a day 5 times a week with a goal of 10-15lb weight loss in the next 3 months. Patient voiced their understanding and motivation to adhere to these recommendations.       Anxiety   Chronic and remains stable.  PHQ9 = 0 and GAD7 = 0.  Will continue Celexa  and Buspar  to take as needed. She denies SI/HI.  Discussed meditation for anxiety.  Will follow-up on mood in 6 months.       Other Visit Diagnoses       Flu vaccine need       Flu vaccine in office today, educated patient.   Relevant Orders   Flu vaccine HIGH DOSE PF(Fluzone Trivalent) (Completed)        Follow up plan: Return in about 2 weeks (around 03/13/2024) for Rectal Bleeding -- virtual.

## 2024-02-28 NOTE — Assessment & Plan Note (Signed)
 Suspect related to hemorrhoids, both internal and external. One external hemorrhoid slightly inflamed. Will start Anusol  cream and suppositories as needed.  Plan for return in 2 weeks, sooner if any worsening, then determine if need to check blood work (CBC, iron) and send to GI.  Recommend she hold ASA until bleeding improved.

## 2024-02-28 NOTE — Assessment & Plan Note (Signed)
Chronic, stable.  Continue Seroquel 25 MG at night as needed.  Educated on medication.  Recommend focus on sleep hygiene techniques and educated on these.  Return in 6 months.

## 2024-02-28 NOTE — Assessment & Plan Note (Signed)
 Noted past labs.  ASCVD 5.5%.  At this time continue diet and regular activity focus, discussed with patient at length.  Consider statin in future if elevations continue, discussed with her and educated her on findings -- will consider Rosuvastatin if trends up continue.  Lipid panel today.

## 2024-02-28 NOTE — Assessment & Plan Note (Signed)
 Recommended eating smaller high protein, low fat meals more frequently and exercising 30 mins a day 5 times a week with a goal of 10-15lb weight loss in the next 3 months. Patient voiced their understanding and motivation to adhere to these recommendations.

## 2024-02-28 NOTE — Assessment & Plan Note (Signed)
 Ongoing, stable. BP well below goal.  Recommend she check BP at home daily and if levels are similar to today then hold Olmesartan  and monitor, if levels increase consistently to >130/80 then restart. Recommend she monitor BP at least a few mornings a week at home and document.  DASH diet at home.  Labs today: BMP.

## 2024-02-28 NOTE — Assessment & Plan Note (Signed)
Chronic and remains stable.  PHQ9 = 0 and GAD7 = 0.  Will continue Celexa and Buspar to take as needed. She denies SI/HI.  Discussed meditation for anxiety.  Will follow-up on mood in 6 months.

## 2024-02-29 ENCOUNTER — Ambulatory Visit: Payer: Self-pay | Admitting: Nurse Practitioner

## 2024-02-29 LAB — LIPID PANEL W/O CHOL/HDL RATIO
Cholesterol, Total: 229 mg/dL — ABNORMAL HIGH (ref 100–199)
HDL: 67 mg/dL (ref >39)
LDL Chol Calc (NIH): 146 mg/dL — ABNORMAL HIGH (ref 0–99)
Triglycerides: 93 mg/dL (ref 0–149)
VLDL Cholesterol Cal: 16 mg/dL (ref 5–40)

## 2024-02-29 LAB — BASIC METABOLIC PANEL WITH GFR
BUN/Creatinine Ratio: 20 (ref 12–28)
BUN: 17 mg/dL (ref 8–27)
CO2: 19 mmol/L — ABNORMAL LOW (ref 20–29)
Calcium: 9.3 mg/dL (ref 8.7–10.3)
Chloride: 106 mmol/L (ref 96–106)
Creatinine, Ser: 0.83 mg/dL (ref 0.57–1.00)
Glucose: 74 mg/dL (ref 70–99)
Potassium: 4.2 mmol/L (ref 3.5–5.2)
Sodium: 141 mmol/L (ref 134–144)
eGFR: 77 mL/min/1.73 (ref >59)

## 2024-02-29 NOTE — Progress Notes (Signed)
 Contacted via MyChart The 10-year ASCVD risk score (Arnett DK, et al., 2019) is: 5.6%   Values used to calculate the score:     Age: 68 years     Clincally relevant sex: Female     Is Non-Hispanic African American: No     Diabetic: No     Tobacco smoker: No     Systolic Blood Pressure: 94 mmHg     Is BP treated: Yes     HDL Cholesterol: 67 mg/dL     Total Cholesterol: 229 mg/dL  Good afternoon Pamela Barrett, your labs have returned: - Kidney function, creatinine and eGFR, remains normal. - Lipid panel continues to show elevations, but continue focus on diet and regular exercise.  Any questions? Keep being amazing!!  Thank you for allowing me to participate in your care.  I appreciate you. Kindest regards, Johnn Krasowski

## 2024-03-10 NOTE — Patient Instructions (Signed)
Rectal Bleeding    Rectal bleeding is when blood comes out of the opening of the butt (anus). You may see bright red blood in your underwear or in the toilet after you poop (have a bowel movement). You may also have blood mixed with your poop (stool), or dark red or black poop.  Rectal bleeding is often a sign that something is wrong. It can be caused by many things. It needs to be checked by a doctor.  Follow these instructions at home:  Medicines  Take over-the-counter and prescription medicines only as told by your doctor.  Ask your doctor about changing or stopping your normal medicines. These include blood thinners.  Managing constipation    Your condition may cause trouble pooping (constipation). To prevent or treat this, or to help make your poop soft, you may need to:  Drink enough fluid to keep your pee (urine) pale yellow.  Take over-the-counter or prescription medicines.  Eat foods that are high in fiber. These include beans, whole grains, and fresh fruits and vegetables.  Limit foods that are high in fat and sugar. These include fried or sweet foods.     General instructions  Try not to strain when you poop.  Take a warm bath. This may help with pain.  Watch for changes in your symptoms.  Contact a doctor if:  You have pain or swelling in your belly (abdomen).  You have a fever.  You feel weak or like you may vomit.  You cannot poop.  You have new or more bleeding.  You have black or dark red poop.  You vomit blood or something that looks like coffee grounds.  Get help right away if:  You faint.  You have very bad pain in your butt.  These symptoms may be an emergency. Get help right away. Call 911.  Do not wait to see if the symptoms will go away.  Do not drive yourself to the hospital.  This information is not intended to replace advice given to you by your health care provider. Make sure you discuss any questions you have with your health care provider.  Document Revised: 01/12/2022 Document Reviewed:  01/12/2022  Elsevier Patient Education  2024 [employer].

## 2024-03-13 ENCOUNTER — Encounter: Payer: Self-pay | Admitting: Nurse Practitioner

## 2024-03-13 ENCOUNTER — Telehealth (INDEPENDENT_AMBULATORY_CARE_PROVIDER_SITE_OTHER): Payer: Medicare (Managed Care) | Admitting: Nurse Practitioner

## 2024-03-13 VITALS — Wt 178.0 lb

## 2024-03-13 DIAGNOSIS — K625 Hemorrhage of anus and rectum: Secondary | ICD-10-CM

## 2024-03-13 NOTE — Assessment & Plan Note (Addendum)
 Acute and improving, no further in the toilet.  Only when wiping on occasion. Continue Anusol  for now. Suspect this was from her inflamed hemorrhoids.  She is aware to return immediately if worsening presents.

## 2024-03-13 NOTE — Progress Notes (Signed)
 Wt 178 lb (80.7 kg)   BMI 30.54 kg/m    Subjective:    Patient ID: Pamela Barrett, female    DOB: [birth year], 68 y.o.   MRN: [id number]  HPI: Pamela Barrett is a 68 y.o. female  Chief Complaint  Patient presents with   Follow-up   Rectal Bleeding   Virtual Visit via Video Note  I connected with Keyleigh J Shen on 03/13/24 at 10:00 AM EDT by a video enabled telemedicine application and verified that I am speaking with the correct person using two identifiers.  Location: Patient: home Provider: work   I discussed the limitations of evaluation and management by telemedicine and the availability of in person appointments. The patient expressed understanding and agreed to proceed.  I discussed the assessment and treatment plan with the patient. The patient was provided an opportunity to ask questions and all were answered. The patient agreed with the plan and demonstrated an understanding of the instructions.   The patient was advised to call back or seek an in-person evaluation if the symptoms worsen or if the condition fails to improve as anticipated.  I provided 25 minutes of non-face-to-face time during this encounter.   Avree Szczygiel T Stelios Kirby, NP   RECTAL BLEEDING Follow-up today for rectal bleeding.  Now has reduced to just spotting on toilet tissues now, pinkish in color.  Has had some for a couple weeks.  Known internal hemorrhoids on past colonoscopy and noted inflamed external hemorrhoid recently on exam. Has BM 1-2 times daily.  No straining. Duration: chronic Bright red rectal bleeding: improved, only on toilet paper now Amount of blood: scant when wiping only Frequency: intermittent Melena: no  Spotting on toilet tissue: yes  Anal fullness: no Perianal pain: no  Severity:  Perianal irritation/itching: no  Constipation: no  Chronic straining/valsava:  no  Anal trauma/intercourse: no  Hemorrhoids: yes  Previous colonoscopy: yes   Relevant past medical,  surgical, family and social history reviewed and updated as indicated. Interim medical history since our last visit reviewed. Allergies and medications reviewed and updated.  Review of Systems  Constitutional:  Negative for activity change, appetite change, diaphoresis, fatigue and fever.  Respiratory:  Negative for cough, chest tightness and shortness of breath.   Cardiovascular:  Negative for chest pain, palpitations and leg swelling.  Gastrointestinal:  Positive for anal bleeding. Negative for abdominal distention, abdominal pain, blood in stool, constipation, diarrhea, nausea, rectal pain and vomiting.  Neurological: Negative.   Psychiatric/Behavioral:  Negative for decreased concentration, self-injury, sleep disturbance and suicidal ideas. The patient is not nervous/anxious.     Per HPI unless specifically indicated above     Objective:    Wt 178 lb (80.7 kg)   BMI 30.54 kg/m   Wt Readings from Last 3 Encounters:  03/13/24 178 lb (80.7 kg)  02/28/24 178 lb 3.2 oz (80.8 kg)  12/20/23 175 lb (79.4 kg)    Physical Exam Vitals and nursing note reviewed.  Constitutional:      General: She is awake. She is not in acute distress.    Appearance: She is well-developed. She is not ill-appearing.  HENT:     Head: Normocephalic.     Right Ear: Hearing normal.     Left Ear: Hearing normal.  Eyes:     General: Lids are normal.        Right eye: No discharge.        Left eye: No discharge.     Conjunctiva/sclera: Conjunctivae normal.  Pulmonary:     Effort: Pulmonary effort is normal. No accessory muscle usage or respiratory distress.  Musculoskeletal:     Cervical back: Normal range of motion.  Neurological:     Mental Status: She is alert and oriented to person, place, and time.  Psychiatric:        Attention and Perception: Attention normal.        Mood and Affect: Mood normal.        Behavior: Behavior normal. Behavior is cooperative.        Thought Content: Thought content  normal.        Judgment: Judgment normal.      Assessment & Plan:   Problem List Items Addressed This Visit       Digestive   Rectal bleeding - Primary   Acute and improving, no further in the toilet.  Only when wiping on occasion. Continue Anusol  for now. Suspect this was from her inflamed hemorrhoids.  She is aware to return immediately if worsening presents.         Follow up plan: Return for as scheduled April 6th, sooner if worsening symptoms.SABRA

## 2024-03-19 ENCOUNTER — Ambulatory Visit
Admission: RE | Admit: 2024-03-19 | Discharge: 2024-03-19 | Disposition: A | Discharge status: Home / Self Care | Payer: Medicare (Managed Care) | Source: Ambulatory Visit | Attending: Nurse Practitioner | Admitting: Nurse Practitioner

## 2024-03-19 DIAGNOSIS — Z1231 Encounter for screening mammogram for malignant neoplasm of breast: Secondary | ICD-10-CM | POA: Diagnosis not present

## 2024-03-21 ENCOUNTER — Ambulatory Visit: Payer: Self-pay | Admitting: Nurse Practitioner

## 2024-03-21 NOTE — Progress Notes (Signed)
 Contacted via MyChart   Normal mammogram, may repeat in one year:)

## 2024-09-10 ENCOUNTER — Encounter: Payer: Medicare (Managed Care) | Admitting: Nurse Practitioner

## 2024-12-25 ENCOUNTER — Ambulatory Visit: Payer: Medicare (Managed Care)
